# Patient Record
Sex: Male | Born: 1985
Health system: Southern US, Community
[De-identification: ages and names within clinical notes are randomized; demographics above are authoritative.]

## PROBLEM LIST (undated history)

## (undated) DIAGNOSIS — J45909 Unspecified asthma, uncomplicated: Secondary | ICD-10-CM

## (undated) DIAGNOSIS — I10 Essential (primary) hypertension: Secondary | ICD-10-CM

## (undated) DIAGNOSIS — M1990 Unspecified osteoarthritis, unspecified site: Secondary | ICD-10-CM

## (undated) DIAGNOSIS — M109 Gout, unspecified: Secondary | ICD-10-CM

## (undated) HISTORY — PX: KNEE ARTHROSCOPY: SHX127

---
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
## 2003-09-04 ENCOUNTER — Emergency Department (HOSPITAL_COMMUNITY): Admission: EM | Admit: 2003-09-04 | Discharge: 2003-09-04 | Payer: Self-pay | Admitting: Emergency Medicine

## 2003-10-21 ENCOUNTER — Emergency Department (HOSPITAL_COMMUNITY): Admission: EM | Admit: 2003-10-21 | Discharge: 2003-10-22 | Payer: Self-pay | Admitting: Emergency Medicine

## 2003-11-23 ENCOUNTER — Emergency Department (HOSPITAL_COMMUNITY): Admission: EM | Admit: 2003-11-23 | Discharge: 2003-11-23 | Payer: Self-pay | Admitting: Emergency Medicine

## 2010-12-29 ENCOUNTER — Inpatient Hospital Stay (INDEPENDENT_AMBULATORY_CARE_PROVIDER_SITE_OTHER)
Admission: RE | Admit: 2010-12-29 | Discharge: 2010-12-29 | Disposition: A | Discharge status: Home / Self Care | Payer: Self-pay | Source: Ambulatory Visit | Attending: Emergency Medicine | Admitting: Emergency Medicine

## 2010-12-29 DIAGNOSIS — J45909 Unspecified asthma, uncomplicated: Secondary | ICD-10-CM

## 2011-07-06 ENCOUNTER — Emergency Department (HOSPITAL_COMMUNITY): Payer: Self-pay

## 2011-07-06 ENCOUNTER — Emergency Department (HOSPITAL_COMMUNITY)
Admission: EM | Admit: 2011-07-06 | Discharge: 2011-07-06 | Disposition: A | Discharge status: Home / Self Care | Payer: Self-pay | Attending: Emergency Medicine | Admitting: Emergency Medicine

## 2011-07-06 DIAGNOSIS — M79609 Pain in unspecified limb: Secondary | ICD-10-CM | POA: Insufficient documentation

## 2011-07-06 DIAGNOSIS — S61209A Unspecified open wound of unspecified finger without damage to nail, initial encounter: Secondary | ICD-10-CM | POA: Insufficient documentation

## 2011-07-06 DIAGNOSIS — S6990XA Unspecified injury of unspecified wrist, hand and finger(s), initial encounter: Secondary | ICD-10-CM | POA: Insufficient documentation

## 2011-07-06 DIAGNOSIS — S62339A Displaced fracture of neck of unspecified metacarpal bone, initial encounter for closed fracture: Secondary | ICD-10-CM | POA: Insufficient documentation

## 2011-10-08 ENCOUNTER — Emergency Department (HOSPITAL_COMMUNITY)
Admission: EM | Admit: 2011-10-08 | Discharge: 2011-10-08 | Disposition: A | Discharge status: Home / Self Care | Payer: Self-pay | Source: Home / Self Care | Attending: Emergency Medicine | Admitting: Emergency Medicine

## 2011-10-08 ENCOUNTER — Emergency Department (INDEPENDENT_AMBULATORY_CARE_PROVIDER_SITE_OTHER): Payer: Self-pay

## 2011-10-08 ENCOUNTER — Encounter: Payer: Self-pay | Admitting: *Deleted

## 2011-10-08 DIAGNOSIS — R6889 Other general symptoms and signs: Secondary | ICD-10-CM

## 2011-10-08 DIAGNOSIS — J111 Influenza due to unidentified influenza virus with other respiratory manifestations: Secondary | ICD-10-CM

## 2011-10-08 MED ORDER — HYDROCODONE-ACETAMINOPHEN 5-325 MG PO TABS
ORAL_TABLET | ORAL | Status: AC
  Filled 2011-10-08: qty 1

## 2011-10-08 MED ORDER — ALBUTEROL SULFATE HFA 108 (90 BASE) MCG/ACT IN AERS: 1.0000 | INHALATION_SPRAY | Freq: Four times a day (QID) | RESPIRATORY_TRACT | Status: DC | PRN

## 2011-10-08 MED ORDER — TRAMADOL HCL 50 MG PO TABS
100.0000 mg | ORAL_TABLET | Freq: Three times a day (TID) | ORAL | Status: AC | PRN
Start: 2011-10-08 — End: 2011-10-18

## 2011-10-08 MED ORDER — HYDROCODONE-ACETAMINOPHEN 5-325 MG PO TABS
1.0000 | ORAL_TABLET | Freq: Once | ORAL | Status: AC
  Administered 2011-10-08: 1 via ORAL

## 2011-10-08 MED ORDER — BENZONATATE 200 MG PO CAPS: 200.0000 mg | ORAL_CAPSULE | Freq: Three times a day (TID) | ORAL | Status: AC | PRN

## 2011-10-08 MED ORDER — OSELTAMIVIR PHOSPHATE 75 MG PO CAPS: 75.0000 mg | ORAL_CAPSULE | Freq: Two times a day (BID) | ORAL | Status: AC

## 2011-10-08 NOTE — ED Provider Notes (Signed)
History     CSN: [id number] Arrival date & time: 10/08/2011  5:31 PM   First MD Initiated Contact with Patient 10/08/11 1547      Chief Complaint  Patient presents with  . Cough    (Consider location/radiation/quality/duration/timing/severity/associated sxs/prior treatment) HPI Comments: Mr. Karasik has had a one-day history of cough productive of brown sputum, generalized aching, fever up to 101, chills, sweats, fatigue, nasal congestion, sore throat, nausea, and diarrhea. He has not had any known exposures. He has not had a flu shot. He has tried Tylenol flu and cough for the symptoms without relief. He is a cigarette smoker.  Patient is a 25 y.o. male presenting with cough.  Cough Associated symptoms include chills, headaches, rhinorrhea and sore throat. Pertinent negatives include no ear pain, no shortness of breath, no wheezing and no eye redness.    History reviewed. No pertinent past medical history.  Past Surgical History  Procedure Date  . Knee surgery     Family History  Problem Relation Age of Onset  . Hypertension Mother   . Asthma Mother     History  Substance Use Topics  . Smoking status: Current Everyday Smoker  . Smokeless tobacco: Not on file  . Alcohol Use: Yes     occasonal      Review of Systems  Constitutional: Positive for fever, chills, diaphoresis and fatigue.  HENT: Positive for congestion, sore throat and rhinorrhea. Negative for ear pain, sneezing, neck stiffness, voice change and postnasal drip.   Eyes: Negative for pain, discharge and redness.  Respiratory: Positive for cough. Negative for chest tightness, shortness of breath and wheezing.   Gastrointestinal: Positive for nausea and diarrhea. Negative for vomiting and abdominal pain.  Skin: Negative for rash.  Neurological: Positive for headaches.    Allergies  Review of patient's allergies indicates no known allergies.  Home Medications   Current Outpatient Rx  Name Route Sig  Dispense Refill  . PSEUDOEPHEDRINE-ACETAMINOPHEN 30-500 MG PO TABS Oral Take 1 tablet by mouth every 4 (four) hours as needed.      . ALBUTEROL SULFATE HFA 108 (90 BASE) MCG/ACT IN AERS Inhalation Inhale 1-2 puffs into the lungs every 6 (six) hours as needed for wheezing. 1 Inhaler 0  . BENZONATATE 200 MG PO CAPS Oral Take 1 capsule (200 mg total) by mouth 3 (three) times daily as needed for cough. 30 capsule 0  . OSELTAMIVIR PHOSPHATE 75 MG PO CAPS Oral Take 1 capsule (75 mg total) by mouth every 12 (twelve) hours. 10 capsule 0  . TRAMADOL HCL 50 MG PO TABS Oral Take 2 tablets (100 mg total) by mouth every 8 (eight) hours as needed for pain. Maximum dose= 8 tablets per day 30 tablet 0    BP 136/88  Pulse 84  Temp(Src) 100.3 F (37.9 C) (Oral)  Resp 19  SpO2 98%  Physical Exam  Nursing note and vitals reviewed. Constitutional: He appears well-developed and well-nourished. No distress.  HENT:  Head: Normocephalic and atraumatic.  Right Ear: External ear normal.  Left Ear: External ear normal.  Nose: Nose normal.  Mouth/Throat: Oropharynx is clear and moist. No oropharyngeal exudate.  Eyes: Conjunctivae and EOM are normal. Pupils are equal, round, and reactive to light. Right eye exhibits no discharge. Left eye exhibits no discharge.  Neck: Normal range of motion. Neck supple.  Cardiovascular: Normal rate, regular rhythm and normal heart sounds.   Pulmonary/Chest: Effort normal and breath sounds normal. No stridor. No respiratory distress. He has  no wheezes. He has no rales. He exhibits no tenderness.  Lymphadenopathy:    He has no cervical adenopathy.  Skin: Skin is warm and dry. No rash noted. He is not diaphoretic.    ED Course  Procedures (including critical care time)  Labs Reviewed - No data to display Dg Chest 2 View  10/08/2011  *RADIOLOGY REPORT*  Clinical Data: 25 year old male with cough and fever.  CHEST - 2 VIEW  Comparison: None  Findings: Upper limits normal heart  size identified. The lungs are clear. There is no evidence of focal airspace disease, pulmonary edema, pulmonary nodule/mass, pleural effusion, or pneumothorax. No acute bony abnormalities are identified.  IMPRESSION: Upper limits normal heart size without evidence of acute cardiopulmonary disease.  Original Report Authenticated By: Rosendo Gros, M.D.     1. Influenza-like illness       MDM  He appears to have an influenza-like illness. We discussed treatment with Tamiflu, but he doesn't think he can afford the medication. So we'll otherwise treat symptomatically.        Roque Lias, MD 10/08/11 [phone]

## 2011-10-08 NOTE — ED Notes (Signed)
Pt  Reports  Cough  Aching all  Over  Chills  sorethroat         Symptoms  Began this  Am           Speaking in clear  sentances   No  resp  Distress

## 2011-11-06 ENCOUNTER — Emergency Department (HOSPITAL_COMMUNITY)
Admission: EM | Admit: 2011-11-06 | Discharge: 2011-11-06 | Disposition: A | Discharge status: Home / Self Care | Payer: Self-pay | Attending: Emergency Medicine | Admitting: Emergency Medicine

## 2011-11-06 ENCOUNTER — Encounter (HOSPITAL_COMMUNITY): Payer: Self-pay

## 2011-11-06 DIAGNOSIS — R1110 Vomiting, unspecified: Secondary | ICD-10-CM | POA: Insufficient documentation

## 2011-11-06 DIAGNOSIS — R1013 Epigastric pain: Secondary | ICD-10-CM | POA: Insufficient documentation

## 2011-11-06 DIAGNOSIS — K2970 Gastritis, unspecified, without bleeding: Secondary | ICD-10-CM | POA: Insufficient documentation

## 2011-11-06 DIAGNOSIS — R10816 Epigastric abdominal tenderness: Secondary | ICD-10-CM | POA: Insufficient documentation

## 2011-11-06 DIAGNOSIS — R197 Diarrhea, unspecified: Secondary | ICD-10-CM | POA: Insufficient documentation

## 2011-11-06 DIAGNOSIS — F17200 Nicotine dependence, unspecified, uncomplicated: Secondary | ICD-10-CM | POA: Insufficient documentation

## 2011-11-06 LAB — COMPREHENSIVE METABOLIC PANEL
ALT: 39 U/L (ref 0–53)
AST: 26 U/L (ref 0–37)
Calcium: 9.8 mg/dL (ref 8.4–10.5)
Creatinine, Ser: 0.97 mg/dL (ref 0.50–1.35)
GFR calc Af Amer: >90 mL/min (ref >90)
Sodium: 136 mEq/L (ref 135–145)
Total Protein: 7.8 g/dL (ref 6.0–8.3)

## 2011-11-06 LAB — CBC
MCH: 31 pg (ref 26.0–34.0)
MCHC: 35.9 g/dL (ref 30.0–36.0)
MCV: 86.2 fL (ref 78.0–100.0)
Platelets: 172 10*3/uL (ref 150–400)
RDW: 12.4 % (ref 11.5–15.5)

## 2011-11-06 LAB — DIFFERENTIAL
Basophils Absolute: 0 10*3/uL (ref 0.0–0.1)
Eosinophils Absolute: 0 10*3/uL (ref 0.0–0.7)
Eosinophils Relative: 0 % (ref 0–5)

## 2011-11-06 LAB — LIPASE, BLOOD: Lipase: 19 U/L (ref 11–59)

## 2011-11-06 MED ORDER — PANTOPRAZOLE SODIUM 40 MG IV SOLR
40.0000 mg | Freq: Once | INTRAVENOUS | Status: AC
  Administered 2011-11-06: 40 mg via INTRAVENOUS
  Filled 2011-11-06: qty 40

## 2011-11-06 MED ORDER — PROMETHAZINE HCL 25 MG PO TABS: 25.0000 mg | ORAL_TABLET | Freq: Four times a day (QID) | ORAL | Status: AC | PRN

## 2011-11-06 MED ORDER — RANITIDINE HCL 150 MG PO CAPS: 150.0000 mg | ORAL_CAPSULE | Freq: Two times a day (BID) | ORAL | Status: DC

## 2011-11-06 MED ORDER — SODIUM CHLORIDE 0.9 % IV BOLUS (SEPSIS)
1000.0000 mL | Freq: Once | INTRAVENOUS | Status: AC
  Administered 2011-11-06: 1000 mL via INTRAVENOUS

## 2011-11-06 MED ORDER — ONDANSETRON HCL 4 MG/2ML IJ SOLN
4.0000 mg | Freq: Once | INTRAMUSCULAR | Status: AC
  Administered 2011-11-06: 4 mg via INTRAVENOUS
  Filled 2011-11-06: qty 2

## 2011-11-06 NOTE — ED Provider Notes (Cosign Needed)
History     CSN: [id number]  Arrival date & time 11/06/11  1438   First MD Initiated Contact with Patient 11/06/11 1828      Chief Complaint  Patient presents with  . Abdominal Pain    (Consider location/radiation/quality/duration/timing/severity/associated sxs/prior treatment) Patient is a 25 y.o. male presenting with abdominal pain. The history is provided by the patient (Patient states that he has been having vomiting and some diarrhea with mild epigastric discomfort since he drank a lot of alcohol last night.).  Abdominal Pain The primary symptoms of the illness include abdominal pain, vomiting and diarrhea. The primary symptoms of the illness do not include fever, fatigue or shortness of breath. The current episode started 13 to 24 hours ago. The onset of the illness was sudden.  The abdominal pain began 3 to 5 hours ago. The pain came on suddenly. The abdominal pain does not radiate. The severity of the abdominal pain is 2/10. The abdominal pain is relieved by nothing. The abdominal pain is exacerbated by movement.  The illness is associated with alcohol use. The patient states that she believes she is currently not pregnant. The patient has not had a change in bowel habit. Symptoms associated with the illness do not include heartburn, hematuria, frequency or back pain.    History reviewed. No pertinent past medical history.  Past Surgical History  Procedure Date  . Knee surgery     Family History  Problem Relation Age of Onset  . Hypertension Mother   . Asthma Mother     History  Substance Use Topics  . Smoking status: Current Everyday Smoker  . Smokeless tobacco: Not on file  . Alcohol Use: Yes     occasonal      Review of Systems  Constitutional: Negative for fever and fatigue.  HENT: Negative for congestion, sinus pressure and ear discharge.   Eyes: Negative for discharge.  Respiratory: Negative for cough and shortness of breath.   Cardiovascular: Negative  for chest pain.  Gastrointestinal: Positive for vomiting, abdominal pain and diarrhea. Negative for heartburn.  Genitourinary: Negative for frequency and hematuria.  Musculoskeletal: Negative for back pain.  Skin: Negative for rash.  Neurological: Negative for seizures and headaches.  Hematological: Negative.   Psychiatric/Behavioral: Negative for hallucinations.    Allergies  Fish allergy and Other  Home Medications   Current Outpatient Rx  Name Route Sig Dispense Refill  . ACETAMINOPHEN 500 MG PO TABS Oral Take 1,500 mg by mouth every 6 (six) hours as needed. For pain    . PROMETHAZINE HCL 25 MG PO TABS Oral Take 1 tablet (25 mg total) by mouth every 6 (six) hours as needed for nausea. 15 tablet 0  . RANITIDINE HCL 150 MG PO CAPS Oral Take 1 capsule (150 mg total) by mouth 2 (two) times daily. 60 capsule 0    BP 139/98  Pulse 62  Temp(Src) 97.9 F (36.6 C) (Oral)  Resp 16  Ht 6' (1.829 m)  Wt 200 lb (90.719 kg)  BMI 27.12 kg/m2  SpO2 99%  Physical Exam  Constitutional: He is oriented to person, place, and time. He appears well-developed.  HENT:  Head: Normocephalic and atraumatic.  Eyes: Conjunctivae and EOM are normal. No scleral icterus.  Neck: Neck supple. No thyromegaly present.  Cardiovascular: Normal rate and regular rhythm.  Exam reveals no gallop and no friction rub.   No murmur heard. Pulmonary/Chest: No stridor. He has no wheezes. He has no rales. He exhibits no tenderness.  Abdominal: He exhibits no distension. There is tenderness. There is no rebound.       Mild epigastric tendernous  Musculoskeletal: Normal range of motion. He exhibits no edema.  Lymphadenopathy:    He has no cervical adenopathy.  Neurological: He is oriented to person, place, and time. Coordination normal.  Skin: No rash noted. No erythema.  Psychiatric: He has a normal mood and affect. His behavior is normal.    ED Course  Procedures (including critical care time)  Labs Reviewed    CBC - Abnormal; Notable for the following:    WBC 10.8 (*)    All other components within normal limits  DIFFERENTIAL - Abnormal; Notable for the following:    Neutrophils Relative 86 (*)    Neutro Abs 9.3 (*)    Monocytes Relative 2 (*)    All other components within normal limits  COMPREHENSIVE METABOLIC PANEL  ETHANOL  LIPASE, BLOOD   No results found.   1. Gastritis       MDM  Pt improved with fluids and protonix,  gastritis        Benny Lennert, MD 11/06/11 [phone]

## 2011-11-06 NOTE — ED Notes (Signed)
Pt. Was drinking all night and woke up with severe abdominal pain n/v

## 2012-04-17 ENCOUNTER — Emergency Department (HOSPITAL_COMMUNITY)
Admission: EM | Admit: 2012-04-17 | Discharge: 2012-04-17 | Disposition: A | Discharge status: Home / Self Care | Payer: Self-pay | Attending: Emergency Medicine | Admitting: Emergency Medicine

## 2012-04-17 ENCOUNTER — Encounter (HOSPITAL_COMMUNITY): Payer: Self-pay | Admitting: *Deleted

## 2012-04-17 ENCOUNTER — Emergency Department (HOSPITAL_COMMUNITY): Payer: Self-pay

## 2012-04-17 DIAGNOSIS — IMO0001 Reserved for inherently not codable concepts without codable children: Secondary | ICD-10-CM | POA: Insufficient documentation

## 2012-04-17 DIAGNOSIS — J45909 Unspecified asthma, uncomplicated: Secondary | ICD-10-CM | POA: Insufficient documentation

## 2012-04-17 DIAGNOSIS — R509 Fever, unspecified: Secondary | ICD-10-CM | POA: Insufficient documentation

## 2012-04-17 HISTORY — DX: Unspecified asthma, uncomplicated: J45.909

## 2012-04-17 LAB — COMPREHENSIVE METABOLIC PANEL
ALT: 32 U/L (ref 0–53)
AST: 22 U/L (ref 0–37)
Albumin: 4.1 g/dL (ref 3.5–5.2)
Alkaline Phosphatase: 98 U/L (ref 39–117)
BUN: 12 mg/dL (ref 6–23)
Chloride: 102 mEq/L (ref 96–112)
Potassium: 3.6 mEq/L (ref 3.5–5.1)
Sodium: 136 mEq/L (ref 135–145)
Total Bilirubin: 0.7 mg/dL (ref 0.3–1.2)
Total Protein: 7.2 g/dL (ref 6.0–8.3)

## 2012-04-17 LAB — CBC
HCT: 37.5 % — ABNORMAL LOW (ref 39.0–52.0)
RDW: 12.1 % (ref 11.5–15.5)
WBC: 14.5 10*3/uL — ABNORMAL HIGH (ref 4.0–10.5)

## 2012-04-17 LAB — LACTIC ACID, PLASMA: Lactic Acid, Venous: 1.3 mmol/L (ref 0.5–2.2)

## 2012-04-17 MED ORDER — EPINEPHRINE 0.3 MG/0.3ML IJ DEVI: 0.3000 mg | Freq: Once | INTRAMUSCULAR | Status: DC

## 2012-04-17 MED ORDER — ACETAMINOPHEN 500 MG PO TABS
1000.0000 mg | ORAL_TABLET | Freq: Once | ORAL | Status: AC
  Administered 2012-04-17: 1000 mg via ORAL
  Filled 2012-04-17: qty 2

## 2012-04-17 MED ORDER — SODIUM CHLORIDE 0.9 % IV BOLUS (SEPSIS)
2000.0000 mL | Freq: Once | INTRAVENOUS | Status: AC
  Administered 2012-04-17: 2000 mL via INTRAVENOUS

## 2012-04-17 MED ORDER — IBUPROFEN 800 MG PO TABS
800.0000 mg | ORAL_TABLET | Freq: Once | ORAL | Status: AC
  Administered 2012-04-17: 800 mg via ORAL
  Filled 2012-04-17: qty 1

## 2012-04-17 MED ORDER — ONDANSETRON HCL 4 MG/2ML IJ SOLN
INTRAMUSCULAR | Status: AC
  Administered 2012-04-17: 17:00:00
  Filled 2012-04-17: qty 2

## 2012-04-17 NOTE — ED Notes (Signed)
Per EMS report, pt from home: sx started this am when he got home from work, got progressively worse, endorses N/V, Weak but able to walk down steps, 18 left AC, 4mg  zofran, fluids but TKO, hot to touch but pt says he's freezing.  VSS. NSR enroute, not vomiting witness.

## 2012-04-17 NOTE — Discharge Instructions (Signed)
Fever, Adult Take Tylenol as directed every 4 hours while awake for temperature higher than 100.4. Go to an urgent care Center or return if you feel worse tomorrow or sooner as needed. Make sure to  drink lots of fluids A fever is a temperature of 100.4 F (38 C) or above.  HOME CARE  Take fever medicine as told by your doctor. Do not  take aspirin for fever if you are younger than 28 years of age.   If you are given antibiotic medicine, take it as told. Finish the medicine even if you start to feel better.   Rest.   Drink enough fluids to keep your pee (urine) clear or pale yellow. Do not drink alcohol.   Take a bath or shower with room temperature water. Do not use ice water or alcohol sponge baths.   Wear lightweight, loose clothes.  GET HELP RIGHT AWAY IF:   You are short of breath or have trouble breathing.   You are very weak.   You are dizzy or you pass out (faint).   You are very thirsty or are making little or no urine.   You have new pain.   You throw up (vomit) or have watery poop (diarrhea).   You keep throwing up or having watery poop for more than 1 to 2 days.   You have a stiff neck or light bothers your eyes.   You have a skin rash.   You have a fever or problems (symptoms) that last for more than 2 to 3 days.   You have a fever and your problems quickly get worse.   You keep throwing up the fluids you drink.   You do not feel better after 3 days.   You have new problems.  MAKE SURE YOU:   Understand these instructions.   Will watch your condition.   Will get help right away if you are not doing well or get worse.  Document Released: 08/04/2008 Document Revised: 10/15/2011 Document Reviewed: 08/27/2011 [facility] Patient Information 2012 [place], [place].

## 2012-04-17 NOTE — ED Notes (Signed)
Pt verbalizes understanding 

## 2012-04-17 NOTE — ED Provider Notes (Addendum)
History     CSN: [id number]  Arrival date & time 04/17/12  1616   First MD Initiated Contact with Patient 04/17/12 1657      Chief Complaint  Patient presents with  . Generalized Body Aches    (Consider location/radiation/quality/duration/timing/severity/associated sxs/prior treatment) HPI Presents with diffuse body aches and myalgias chills leg abdominal discomfort onset 5 AM today denies headache denies chest pain admits to slight cough. Vomited one time since onset of illness treated by EMS with Zofran. Present he denies nausea. No other associated symptoms. Other associated symptoms include a rash on patient's back for 2 days noted by his girlfriend Past Medical History  Diagnosis Date  . Asthma     Past Surgical History  Procedure Date  . Knee surgery   . Knee surgery     Family History  Problem Relation Age of Onset  . Hypertension Mother   . Asthma Mother     History  Substance Use Topics  . Smoking status: Current Everyday Smoker  . Smokeless tobacco: Not on file  . Alcohol Use: Yes     occasonal      Review of Systems  Constitutional: Negative.   HENT: Negative.   Respiratory: Positive for cough.   Cardiovascular: Negative.   Gastrointestinal: Positive for vomiting.  Musculoskeletal: Positive for myalgias.  Skin: Positive for rash.  Neurological: Negative.   Hematological: Negative.   Psychiatric/Behavioral: Negative.   All other systems reviewed and are negative.    Allergies  Fish allergy and Other  Home Medications   Current Outpatient Rx  Name Route Sig Dispense Refill  . ALBUTEROL SULFATE HFA 108 (90 BASE) MCG/ACT IN AERS Inhalation Inhale 2 puffs into the lungs every 6 (six) hours as needed. Wheezing or shortness of breath.      BP 111/63  Pulse 98  Temp(Src) 102.4 F (39.1 C) (Oral)  Resp 18  SpO2 98%  Physical Exam  Nursing note and vitals reviewed. Constitutional: He appears well-developed and well-nourished.       Sleepy  arousable easily  HENT:  Head: Normocephalic and atraumatic.  Eyes: Conjunctivae are normal. Pupils are equal, round, and reactive to light.  Neck: Neck supple. No tracheal deviation present. No thyromegaly present.  Cardiovascular: Normal rate and regular rhythm.   No murmur heard. Pulmonary/Chest: Effort normal and breath sounds normal.  Abdominal: Soft. Bowel sounds are normal. He exhibits no distension. There is no tenderness.       OBese  Genitourinary: Penis normal.  Musculoskeletal: Normal range of motion. He exhibits no edema and no tenderness.  Neurological: He is alert. Coordination normal.  Skin: Skin is warm and dry. No rash noted.       There is an baseball sized psoriatic appearing patch of skin at right scapular area  Psychiatric: He has a normal mood and affect.    ED Course  Procedures (including critical care time)  Labs Reviewed - No data to display No results found. 9:45 PM patient is alert awake feels much improved after treatment with antipyretics and intravenous fluids. The patient now asymptomatic alert and talkative. Rash is unchanged No diagnosis found.  Results for orders placed during the hospital encounter of 04/17/12  LACTIC ACID, PLASMA      Component Value Range   Lactic Acid, Venous 1.3  0.5 - 2.2 (mmol/L)  CBC      Component Value Range   WBC 14.5 (*) 4.0 - 10.5 (K/uL)   RBC 4.41  4.22 - 5.81 (MIL/uL)  Hemoglobin 13.4  13.0 - 17.0 (g/dL)   HCT 19.1 (*) 47.8 - 52.0 (%)   MCV 85.0  78.0 - 100.0 (fL)   MCH 30.4  26.0 - 34.0 (pg)   MCHC 35.7  30.0 - 36.0 (g/dL)   RDW 29.5  62.1 - 30.8 (%)   Platelets 166  150 - 400 (K/uL)  COMPREHENSIVE METABOLIC PANEL      Component Value Range   Sodium 136  135 - 145 (mEq/L)   Potassium 3.6  3.5 - 5.1 (mEq/L)   Chloride 102  96 - 112 (mEq/L)   CO2 24  19 - 32 (mEq/L)   Glucose, Bld 98  70 - 99 (mg/dL)   BUN 12  6 - 23 (mg/dL)   Creatinine, Ser 6.57  0.50 - 1.35 (mg/dL)   Calcium 9.1  8.4 - 84.6  (mg/dL)   Total Protein 7.2  6.0 - 8.3 (g/dL)   Albumin 4.1  3.5 - 5.2 (g/dL)   AST 22  0 - 37 (U/L)   ALT 32  0 - 53 (U/L)   Alkaline Phosphatase 98  39 - 117 (U/L)   Total Bilirubin 0.7  0.3 - 1.2 (mg/dL)   GFR calc non Af Amer >90  >90 (mL/min)   GFR calc Af Amer >90  >90 (mL/min)   Dg Chest 2 View  04/17/2012  *RADIOLOGY REPORT*  Clinical Data: Generalized body aches.  Cough and fever.  CHEST - 2 VIEW  Comparison: 10/08/2011  Findings: Lateral view degraded by patient arm position, minimal motion. Midline trachea.  Mild cardiomegaly. Mediastinal contours otherwise within normal limits.  No pleural effusion or pneumothorax.  No congestive failure.  Low lung volumes with resultant pulmonary interstitial prominence.  IMPRESSION: Cardiomegaly and low lung volumes. No acute findings.  Degraded lateral view.  Original Report Authenticated By: Consuello Bossier, M.D.   Xray reviewed by me  MDM  Patient likely with psoriasis as cause of rash. His girlfriend states that he had similar patches in the past which have healed spontaneously. Rash is not indicative ofECM Assessment suspect viral illness Plan Tylenol encourage fluids. Prescription for EpiPen at patient's request as he has no primary care physician and is allergic bees Recheck tomorrow if not feeling improved Diagnosis febrile illness       Doug Sou, MD 04/17/12 2158  Doug Sou, MD 04/17/12 2159

## 2012-04-17 NOTE — ED Notes (Signed)
1st set of blood cultures sent to lab.  

## 2012-04-17 NOTE — ED Notes (Signed)
VHQ:IO96<EX> Expected date:04/17/12<BR> Expected time: 3:57 PM<BR> Means of arrival:Ambulance<BR> Comments:<BR> M11. 25 m. FLU LIKE SYMPTOMS. Started this morning, sudden onset, achy all over, hot to touch, vomiting. 4mg  zofran. 10 mins

## 2012-09-15 ENCOUNTER — Emergency Department (INDEPENDENT_AMBULATORY_CARE_PROVIDER_SITE_OTHER)
Admission: EM | Admit: 2012-09-15 | Discharge: 2012-09-15 | Disposition: A | Discharge status: Home / Self Care | Payer: Self-pay | Source: Home / Self Care | Attending: Emergency Medicine | Admitting: Emergency Medicine

## 2012-09-15 ENCOUNTER — Encounter (HOSPITAL_COMMUNITY): Payer: Self-pay | Admitting: Emergency Medicine

## 2012-09-15 DIAGNOSIS — I891 Lymphangitis: Secondary | ICD-10-CM

## 2012-09-15 DIAGNOSIS — T63481A Toxic effect of venom of other arthropod, accidental (unintentional), initial encounter: Secondary | ICD-10-CM

## 2012-09-15 DIAGNOSIS — T6391XA Toxic effect of contact with unspecified venomous animal, accidental (unintentional), initial encounter: Secondary | ICD-10-CM

## 2012-09-15 MED ORDER — DOXYCYCLINE HYCLATE 100 MG PO CAPS: 100.0000 mg | ORAL_CAPSULE | Freq: Two times a day (BID) | ORAL | Status: DC

## 2012-09-15 MED ORDER — PREDNISONE 20 MG PO TABS: 40.0000 mg | ORAL_TABLET | Freq: Every day | ORAL | Status: AC

## 2012-09-15 MED ORDER — EPINEPHRINE 0.3 MG/0.3ML IJ DEVI: 0.3000 mg | Freq: Once | INTRAMUSCULAR | Status: DC

## 2012-09-15 NOTE — ED Provider Notes (Signed)
History     CSN: [id number]  Arrival date & time 09/15/12  1129   First MD Initiated Contact with Patient 09/15/12 1320      Chief Complaint  Patient presents with  . Insect Bite    pt states swelling of left arm and hand  pt ? bee sting, allergic to bees was swatting at a bee yesterday.    (Consider location/radiation/quality/duration/timing/severity/associated sxs/prior treatment) HPI Comments: Patient presents urgent care describing that yesterday he was swatting atopy any suspect that maybe it partially stung him somewhere between his fingers on his left hand as it became red and swollen. 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 (Patient denies using an EpiPen and denies having experienced any swelling of his mouth, throat, or having difficulty breathing).  Patient describes that the redness has slowly ascended to his finger to the dorsal aspect of his hand and now is only up to the mid region of his left forearm. It is tender at touch swollen and there is a streak of redness. Patient denies any fevers chills or changes in appetite.  The history is provided by the patient.    Past Medical History  Diagnosis Date  . Asthma     Past Surgical History  Procedure Date  . Knee surgery   . Knee surgery     Family History  Problem Relation Age of Onset  . Hypertension Mother   . Asthma Mother     History  Substance Use Topics  . Smoking status: Current Every Day Smoker  . Smokeless tobacco: Not on file  . Alcohol Use: Yes     Comment: occasonal      Review of Systems  Constitutional: Negative for fever, chills, activity change and appetite change.  Skin: Positive for color change and rash.    Allergies  Bee venom; Fish allergy; and Other  Home Medications   Current Outpatient Rx  Name  Route  Sig  Dispense  Refill  . ALBUTEROL SULFATE HFA 108 (90 BASE) MCG/ACT IN AERS   Inhalation  Inhale 2 puffs into the lungs every 6 (six) hours as needed. Wheezing or shortness of breath.         . DOXYCYCLINE HYCLATE 100 MG PO CAPS   Oral   Take 1 capsule (100 mg total) by mouth 2 (two) times daily.   20 capsule   0   . EPINEPHRINE 0.3 MG/0.3ML IJ DEVI   Intramuscular   Inject 0.3 mLs (0.3 mg total) into the muscle once.   1 Device   0   . PREDNISONE 20 MG PO TABS   Oral   Take 2 tablets (40 mg total) by mouth daily. 2 tablets daily for 5 days   10 tablet   0     BP 156/84  Pulse 62  Temp 98.2 F (36.8 C) (Oral)  Resp 16  SpO2 100%  Physical Exam  Nursing note and vitals reviewed. Constitutional: Vital signs are normal. He appears well-developed and well-nourished.  Non-toxic appearance. He does not have a sickly appearance. He does not appear ill. No distress.  Musculoskeletal: He exhibits tenderness.       Arms: Neurological: He is alert.  Skin: There is erythema.    ED Course  Procedures (including critical care time)  Labs Reviewed - No data to display No results found.   1. Insect sting   2. Lymphangitis  MDM  Allergenic reaction versus ascending lymphangitis. Although etiology could very well be a allergenic physiology will start patient on antibiotics as area feels tender warm, consider a secondary superimposed bacterial infection on this insect sting. Patient is otherwise comfortable in no distress and with no mucosa or respiratory distress. Have also encouraged patient to use ice packs. Provided him with a prescription refill of his EpiPen.       Jimmie Molly, MD 09/15/12 1535

## 2012-09-15 NOTE — ED Notes (Signed)
Pt states that he was swatting at a bee yesterday shortly after notice arm red and swollen and having pain.  Red streaking down left arm. Several bites around wrist area and on middle finger. Pt also ?'s some kind of bug bite.  Pt states that the pain is a constant throbbing feeling.

## 2012-09-23 ENCOUNTER — Emergency Department (HOSPITAL_COMMUNITY)
Admission: EM | Admit: 2012-09-23 | Discharge: 2012-09-23 | Disposition: A | Discharge status: Home / Self Care | Payer: Self-pay | Attending: Emergency Medicine | Admitting: Emergency Medicine

## 2012-09-23 ENCOUNTER — Encounter (HOSPITAL_COMMUNITY): Payer: Self-pay | Admitting: *Deleted

## 2012-09-23 DIAGNOSIS — F17200 Nicotine dependence, unspecified, uncomplicated: Secondary | ICD-10-CM | POA: Insufficient documentation

## 2012-09-23 DIAGNOSIS — Z202 Contact with and (suspected) exposure to infections with a predominantly sexual mode of transmission: Secondary | ICD-10-CM | POA: Insufficient documentation

## 2012-09-23 DIAGNOSIS — J45909 Unspecified asthma, uncomplicated: Secondary | ICD-10-CM | POA: Insufficient documentation

## 2012-09-23 MED ORDER — CEFTRIAXONE SODIUM 250 MG IJ SOLR
250.0000 mg | Freq: Once | INTRAMUSCULAR | Status: AC
  Administered 2012-09-23: 250 mg via INTRAMUSCULAR
  Filled 2012-09-23: qty 250

## 2012-09-23 MED ORDER — LIDOCAINE HCL (PF) 1 % IJ SOLN
INTRAMUSCULAR | Status: AC
  Administered 2012-09-23: 0.9 mL
  Filled 2012-09-23: qty 5

## 2012-09-23 MED ORDER — METRONIDAZOLE 500 MG PO TABS: 500.0000 mg | ORAL_TABLET | Freq: Two times a day (BID) | ORAL | Status: DC

## 2012-09-23 MED ORDER — AZITHROMYCIN 250 MG PO TABS
1000.0000 mg | ORAL_TABLET | Freq: Once | ORAL | Status: AC
  Administered 2012-09-23: 1000 mg via ORAL
  Filled 2012-09-23: qty 4

## 2012-09-23 NOTE — ED Notes (Signed)
?  STD - trich. No sx. Gf got std from him. ~ 3 mos.

## 2012-09-23 NOTE — ED Provider Notes (Signed)
History   This chart was scribed for [employer]. Rubin Payor, MD, by Marcina Millard scribe. The patient was seen in room TR06C/TR06C and the patient's care was started at 1846.    CSN: [id number]  Arrival date & time 09/23/12  4098   First MD Initiated Contact with Patient 09/23/12 1846      No chief complaint on file.   (Consider location/radiation/quality/duration/timing/severity/associated sxs/prior treatment) HPI Comments: Cameron Greene is a 26 y.o. male who presents to the Emergency Department complaining of recent exposure to an STD. He states this his girlfriend was just diagnosed with trichomoniasis from an encounter with another person 3 months ago. He denies any current symptoms. He has no known allergies to medication.    Past Medical History  Diagnosis Date  . Asthma     Past Surgical History  Procedure Date  . Knee surgery   . Knee surgery     Family History  Problem Relation Age of Onset  . Hypertension Mother   . Asthma Mother     History  Substance Use Topics  . Smoking status: Current Every Day Smoker  . Smokeless tobacco: Not on file  . Alcohol Use: Yes     Comment: occasonal      Review of Systems  All other systems reviewed and are negative.    Allergies  Bee venom; Fish allergy; and Other  Home Medications   Current Outpatient Rx  Name  Route  Sig  Dispense  Refill  . METRONIDAZOLE 500 MG PO TABS   Oral   Take 1 tablet (500 mg total) by mouth 2 (two) times daily.   14 tablet   0     BP 155/85  Temp 98.2 F (36.8 C) (Oral)  Resp 14  SpO2 98%  Physical Exam  Nursing note and vitals reviewed. Constitutional: He is oriented to person, place, and time. He appears well-developed and well-nourished. No distress.  HENT:  Head: Normocephalic.  Neck: No tracheal deviation present.  Pulmonary/Chest: No respiratory distress.  Abdominal: Soft. He exhibits no distension.  Genitourinary: Penis normal. No penile tenderness.   No skin lesions. Minimal discharge on swab.  Musculoskeletal: Normal range of motion. He exhibits no edema.  Neurological: He is alert and oriented to person, place, and time.  Skin: Skin is warm and dry.  Psychiatric: He has a normal mood and affect. His behavior is normal.    ED Course  Procedures (including critical care time)   COORDINATION OF CARE:  18:55- Discussed planned course of treatment with the patient, including STD testing, who is agreeable at this time.  19:13- Medication Orders- lidocaine (XYLOCAINE) 1% injection  19:15- Medication Orders- azithromycin (ZITHROMAX) tablet 1,000 mg- Once, ceftriaxone (ROCEPHIN) injection 250 mg- Once.   Labs Reviewed  GC/CHLAMYDIA PROBE AMP   No results found.   1. Exposure to STD       MDM  Patient was told by sexual partner that she has trichomoniasis. Patient is without symptoms. Swab was done and patient was treated for gonorrhea and Chlamydia. Patient did not want HIV testing. He'll be treated with Flagyl  I personally performed the services described in this documentation, which was scribed in my presence. The recorded information has been reviewed and is accurate.         Juliet Rude. Rubin Payor, MD 09/23/12 1932

## 2012-09-23 NOTE — ED Notes (Signed)
Patient is alert and orientedx4.  Patient was explained discharge instructions and he does not have any questions.

## 2012-09-24 LAB — GC/CHLAMYDIA PROBE AMP: CT Probe RNA: NEGATIVE

## 2013-07-01 ENCOUNTER — Emergency Department (HOSPITAL_COMMUNITY): Payer: Self-pay

## 2013-07-01 ENCOUNTER — Emergency Department (HOSPITAL_COMMUNITY)
Admission: EM | Admit: 2013-07-01 | Discharge: 2013-07-01 | Disposition: A | Discharge status: Home / Self Care | Payer: Self-pay | Attending: Emergency Medicine | Admitting: Emergency Medicine

## 2013-07-01 ENCOUNTER — Encounter (HOSPITAL_COMMUNITY): Payer: Self-pay | Admitting: *Deleted

## 2013-07-01 DIAGNOSIS — S62309A Unspecified fracture of unspecified metacarpal bone, initial encounter for closed fracture: Secondary | ICD-10-CM

## 2013-07-01 DIAGNOSIS — S62329A Displaced fracture of shaft of unspecified metacarpal bone, initial encounter for closed fracture: Secondary | ICD-10-CM | POA: Insufficient documentation

## 2013-07-01 DIAGNOSIS — Y929 Unspecified place or not applicable: Secondary | ICD-10-CM | POA: Insufficient documentation

## 2013-07-01 DIAGNOSIS — M25441 Effusion, right hand: Secondary | ICD-10-CM

## 2013-07-01 DIAGNOSIS — J45909 Unspecified asthma, uncomplicated: Secondary | ICD-10-CM | POA: Insufficient documentation

## 2013-07-01 DIAGNOSIS — F17200 Nicotine dependence, unspecified, uncomplicated: Secondary | ICD-10-CM | POA: Insufficient documentation

## 2013-07-01 DIAGNOSIS — W2209XA Striking against other stationary object, initial encounter: Secondary | ICD-10-CM | POA: Insufficient documentation

## 2013-07-01 DIAGNOSIS — Y9389 Activity, other specified: Secondary | ICD-10-CM | POA: Insufficient documentation

## 2013-07-01 MED ORDER — HYDROCODONE-ACETAMINOPHEN 5-325 MG PO TABS: 1.0000 | ORAL_TABLET | ORAL | Status: DC | PRN

## 2013-07-01 MED ORDER — HYDROCODONE-ACETAMINOPHEN 5-325 MG PO TABS
2.0000 | ORAL_TABLET | Freq: Once | ORAL | Status: AC
  Administered 2013-07-01: 2 via ORAL
  Filled 2013-07-01: qty 2

## 2013-07-01 MED ORDER — IBUPROFEN 800 MG PO TABS
800.0000 mg | ORAL_TABLET | Freq: Once | ORAL | Status: AC
  Administered 2013-07-01: 800 mg via ORAL
  Filled 2013-07-01: qty 1

## 2013-07-01 NOTE — ED Notes (Signed)
Pt states his right hand got slammed in door,  He has significant swelling and pain

## 2013-07-01 NOTE — Progress Notes (Signed)
Orthopedic Tech Progress Note Patient Details:  Cameron Greene 1986/05/03 [id number]  Ortho Devices Type of Ortho Device: Volar splint   Haskell Flirt 07/01/2013, 6:35 AM

## 2013-07-01 NOTE — ED Provider Notes (Signed)
  CSN: [id number]     Arrival date & time 07/01/13  0419 History     First MD Initiated Contact with Patient 07/01/13 0447     Chief Complaint  Patient presents with  . Hand Injury   (Consider location/radiation/quality/duration/timing/severity/associated sxs/prior Treatment) HPI Comments: 27 yo male with right hand pain and swelling since slamming it into a door yesterday evening.  Pain with movement. Pt was not in a fight.  No other injuries. Initially tried OTC and ice but worsened. No open wounds.    Patient is a 27 y.o. male presenting with hand injury. The history is provided by the patient.  Hand Injury Associated symptoms: no fever     Past Medical History  Diagnosis Date  . Asthma    Past Surgical History  Procedure Laterality Date  . Knee surgery    . Knee surgery     Family History  Problem Relation Age of Onset  . Hypertension Mother   . Asthma Mother    History  Substance Use Topics  . Smoking status: Current Every Day Smoker  . Smokeless tobacco: Not on file  . Alcohol Use: Yes     Comment: occasonal    Review of Systems  Constitutional: Negative for fever.  Musculoskeletal: Positive for joint swelling.  Skin: Positive for wound.  Neurological: Negative for weakness and numbness.    Allergies  Bee venom; Fish allergy; Apple; and Banana  Home Medications  No current outpatient prescriptions on file. BP 140/66  Pulse 84  Resp 18  SpO2 96% Physical Exam  Nursing note and vitals reviewed. Constitutional: He appears well-developed and well-nourished. No distress.  HENT:  Head: Normocephalic and atraumatic.  Cardiovascular: Normal rate.   Pulmonary/Chest: Effort normal.  Musculoskeletal: He exhibits edema and tenderness.  Tender and moderate swelling to all metacarpals dorsally, nv intact, mild decr flexion of all fingers on right hand secondary to swelling and pain, nv intact  Neurological: He is alert.  Skin: Skin is warm.    ED Course    Procedures (including critical care time)  Labs Reviewed - No data to display No results found. No diagnosis found.  MDM  4th metacarpal fx. Pain meds in ED. Xray reviewed, metacarpal fx 4th, right hand.  Splint placed by ortho tech. Fup with ortho discussed.  Dg Hand Complete Right  07/01/2013   *RADIOLOGY REPORT*  Clinical Data: Swelling across the metacarpals after injury yesterday.  RIGHT HAND - COMPLETE 3+ VIEW  Comparison: 07/06/2011  Findings: Transverse fracture of the mid shaft right fourth metacarpal bone with dorsal displacement and volar angulation of the distal fracture fragments.  Dorsal soft tissue swelling.  Old fracture deformity of the fifth metacarpal bone.  No focal bone lesion or bone destruction.  IMPRESSION: Transverse fracture of the mid shaft right fourth metacarpal bone.   Original Report Authenticated By: Burman Nieves, M.D.    DC  Enid Skeens, MD 07/01/13 [phone]

## 2016-12-08 ENCOUNTER — Encounter (HOSPITAL_COMMUNITY): Payer: Self-pay | Admitting: Emergency Medicine

## 2016-12-08 ENCOUNTER — Emergency Department (HOSPITAL_COMMUNITY)
Admission: EM | Admit: 2016-12-08 | Discharge: 2016-12-08 | Disposition: A | Discharge status: Home / Self Care | Payer: Self-pay | Attending: Emergency Medicine | Admitting: Emergency Medicine

## 2016-12-08 ENCOUNTER — Emergency Department (HOSPITAL_COMMUNITY): Payer: Self-pay

## 2016-12-08 DIAGNOSIS — J45901 Unspecified asthma with (acute) exacerbation: Secondary | ICD-10-CM | POA: Insufficient documentation

## 2016-12-08 DIAGNOSIS — Z79899 Other long term (current) drug therapy: Secondary | ICD-10-CM | POA: Insufficient documentation

## 2016-12-08 MED ORDER — IPRATROPIUM BROMIDE 0.02 % IN SOLN
0.5000 mg | Freq: Once | RESPIRATORY_TRACT | Status: AC
  Administered 2016-12-08: 0.5 mg via RESPIRATORY_TRACT
  Filled 2016-12-08: qty 2.5

## 2016-12-08 MED ORDER — ALBUTEROL SULFATE (2.5 MG/3ML) 0.083% IN NEBU
5.0000 mg | INHALATION_SOLUTION | Freq: Once | RESPIRATORY_TRACT | Status: AC
  Administered 2016-12-08: 5 mg via RESPIRATORY_TRACT
  Filled 2016-12-08: qty 6

## 2016-12-08 MED ORDER — PREDNISONE 20 MG PO TABS
60.0000 mg | ORAL_TABLET | Freq: Once | ORAL | Status: AC
  Administered 2016-12-08: 60 mg via ORAL
  Filled 2016-12-08: qty 3

## 2016-12-08 MED ORDER — ALBUTEROL SULFATE HFA 108 (90 BASE) MCG/ACT IN AERS
2.0000 | INHALATION_SPRAY | RESPIRATORY_TRACT | Status: DC | PRN
  Administered 2016-12-08: 2 via RESPIRATORY_TRACT
  Filled 2016-12-08: qty 6.7

## 2016-12-08 MED ORDER — PREDNISONE 20 MG PO TABS: 40.0000 mg | ORAL_TABLET | Freq: Every day | ORAL | 0 refills | Status: DC

## 2016-12-08 NOTE — ED Triage Notes (Signed)
Patient states she has an history of asthma.  He started coughing up blood 3 days ago.  He states he is out of his medication. Has some SOB and cough

## 2016-12-08 NOTE — ED Provider Notes (Signed)
[facility]-EMERGENCY DEPT Provider Note   CSN: [id number] Arrival date & time: 12/08/16  0807     History   Chief Complaint Chief Complaint  Patient presents with  . Cough  . Asthma    HPI Cameron Greene is a 31 y.o. male.  The history is provided by the patient.  Cough  This is a recurrent problem. Episode onset: 3 days ago. The problem occurs constantly. The problem has been gradually worsening. The cough is non-productive (on sat had a few streaks of blood but no other sputum and no further blood). There has been no fever. Associated symptoms include shortness of breath and wheezing. Pertinent negatives include no chest pain, no sweats, no ear congestion, no ear pain, no rhinorrhea and no sore throat. He has tried nothing for the symptoms. The treatment provided no relief. He is a smoker. His past medical history is significant for asthma.  Asthma  Associated symptoms include shortness of breath. Pertinent negatives include no chest pain.    Past Medical History:  Diagnosis Date  . Asthma     There are no active problems to display for this patient.   Past Surgical History:  Procedure Laterality Date  . KNEE SURGERY    . KNEE SURGERY         Home Medications    Prior to Admission medications   Medication Sig Start Date End Date Taking? Authorizing Provider  albuterol (PROVENTIL HFA;VENTOLIN HFA) 108 (90 Base) MCG/ACT inhaler Inhale 1-2 puffs into the lungs every 6 (six) hours as needed for wheezing or shortness of breath.   Yes Historical Provider, MD  HYDROcodone-acetaminophen (NORCO) 5-325 MG per tablet Take 1-2 tablets by mouth every 4 (four) hours as needed for pain. Patient not taking: Reported on 12/08/2016 07/01/13   Blane Ohara, MD    Family History Family History  Problem Relation Age of Onset  . Hypertension Mother   . Asthma Mother     Social History Social History  Substance Use Topics  . Smoking status: Current Every Day Smoker  . Smokeless  tobacco: Current User  . Alcohol use Yes     Comment: occasonal     Allergies   Bee venom; Fish allergy; Apple; and Banana   Review of Systems Review of Systems  HENT: Negative for ear pain, rhinorrhea and sore throat.   Respiratory: Positive for cough, shortness of breath and wheezing.   Cardiovascular: Negative for chest pain.  All other systems reviewed and are negative.    Physical Exam Updated Vital Signs BP 152/80 (BP Location: Left Arm)   Pulse 85   Temp 98.5 F (36.9 C) (Oral)   Resp 18   Ht 6' (1.829 m)   Wt 300 lb (136.1 kg)   SpO2 97%   BMI 40.69 kg/m   Physical Exam  Constitutional: He is oriented to person, place, and time. He appears well-developed and well-nourished. No distress.  HENT:  Head: Normocephalic and atraumatic.  Right Ear: Tympanic membrane normal.  Left Ear: Tympanic membrane normal.  Mouth/Throat: Oropharynx is clear and moist. No oropharyngeal exudate, posterior oropharyngeal edema or posterior oropharyngeal erythema.  Eyes: Conjunctivae and EOM are normal. Pupils are equal, round, and reactive to light.  Neck: Normal range of motion. Neck supple.  Cardiovascular: Normal rate, regular rhythm and intact distal pulses.   No murmur heard. Pulmonary/Chest: Effort normal. No respiratory distress. He has decreased breath sounds. He has wheezes. He has no rales.  Abdominal: Soft. He exhibits no distension.  There is no tenderness. There is no rebound and no guarding.  Musculoskeletal: Normal range of motion. He exhibits no edema or tenderness.  Neurological: He is alert and oriented to person, place, and time.  Skin: Skin is warm and dry. No rash noted. No erythema.  Psychiatric: He has a normal mood and affect. His behavior is normal.  Nursing note and vitals reviewed.    ED Treatments / Results  Labs (all labs ordered are listed, but only abnormal results are displayed) Labs Reviewed - No data to display  EKG  EKG  Interpretation  Date/Time:  Tuesday December 08 2016 08:47:39 EST Ventricular Rate:  73 PR Interval:    QRS Duration: 113 QT Interval:  368 QTC Calculation: 406 R Axis:   6 Text Interpretation:  Sinus rhythm Incomplete right bundle branch block ST elevation suggests acute pericarditis No previous tracing Confirmed by Anitra LauthPLUNKETT  MD, Alphonzo LemmingsWHITNEY ([id number]) on 12/08/2016 9:25:02 AM       Radiology Dg Chest 2 View  Result Date: 12/08/2016 CLINICAL DATA:  Worsening cough and shortness of breath for 3 days. Patient reports history of asthma. EXAM: CHEST  2 VIEW COMPARISON:  04/17/2012 and 10/08/2011 FINDINGS: The lung volumes are low-normal. Heart size is within normal limits. Mediastinal and hilar contours are normal. Pulmonary vascularity is normal. Diffuse mild peribronchial thickening noted. No focal airspace disease or pleural effusion. Negative for pneumothorax. Trachea midline. No acute osseous abnormality. IMPRESSION: Diffuse mild peribronchial thickening. This can be seen in the setting of asthma, smoking, or infectious bronchitis. No focal airspace disease identified. Electronically Signed   By: Britta MccreedySusan  Turner M.D.   On: 12/08/2016 09:21    Procedures Procedures (including critical care time)  Medications Ordered in ED Medications  albuterol (PROVENTIL HFA;VENTOLIN HFA) 108 (90 Base) MCG/ACT inhaler 2 puff (not administered)  albuterol (PROVENTIL) (2.5 MG/3ML) 0.083% nebulizer solution 5 mg (5 mg Nebulization Given 12/08/16 0919)  albuterol (PROVENTIL) (2.5 MG/3ML) 0.083% nebulizer solution 5 mg (5 mg Nebulization Given 12/08/16 1036)  predniSONE (DELTASONE) tablet 60 mg (60 mg Oral Given 12/08/16 1035)  ipratropium (ATROVENT) nebulizer solution 0.5 mg (0.5 mg Nebulization Given 12/08/16 1036)     Initial Impression / Assessment and Plan / ED Course  I have reviewed the triage vital signs and the nursing notes.  Pertinent labs & imaging results that were available during my care of the  patient were reviewed by me and considered in my medical decision making (see chart for details).     Pt with typical asthma exacerbation  Symptoms and he has ran out of his inhaler.  No infectious sx, productive cough or other complaints.  No c/o of flu like sx.  Wheezing on exam.  CXR without signs of PNA and EKG with early repol.  Will give steroids, albuterol/atrovent and recheck.  11:09 AM Pt feeling better after nebs.  Will d/c home with inhaler and prednisone.  Final Clinical Impressions(s) / ED Diagnoses   Final diagnoses:  Exacerbation of persistent asthma, unspecified asthma severity    New Prescriptions New Prescriptions   PREDNISONE (DELTASONE) 20 MG TABLET    Take 2 tablets (40 mg total) by mouth daily.     Gwyneth SproutWhitney Kejuan Bekker, MD 12/08/16 1110

## 2016-12-08 NOTE — ED Notes (Signed)
Esignature not available. 

## 2017-06-21 ENCOUNTER — Emergency Department (HOSPITAL_COMMUNITY): Payer: Self-pay

## 2017-06-21 ENCOUNTER — Emergency Department (HOSPITAL_COMMUNITY)
Admission: EM | Admit: 2017-06-21 | Discharge: 2017-06-21 | Disposition: A | Discharge status: Home / Self Care | Payer: Self-pay | Attending: Emergency Medicine | Admitting: Emergency Medicine

## 2017-06-21 ENCOUNTER — Encounter (HOSPITAL_COMMUNITY): Payer: Self-pay | Admitting: Emergency Medicine

## 2017-06-21 DIAGNOSIS — X509XXA Other and unspecified overexertion or strenuous movements or postures, initial encounter: Secondary | ICD-10-CM | POA: Insufficient documentation

## 2017-06-21 DIAGNOSIS — Z79899 Other long term (current) drug therapy: Secondary | ICD-10-CM | POA: Insufficient documentation

## 2017-06-21 DIAGNOSIS — Y999 Unspecified external cause status: Secondary | ICD-10-CM | POA: Insufficient documentation

## 2017-06-21 DIAGNOSIS — Y929 Unspecified place or not applicable: Secondary | ICD-10-CM | POA: Insufficient documentation

## 2017-06-21 DIAGNOSIS — Y939 Activity, unspecified: Secondary | ICD-10-CM | POA: Insufficient documentation

## 2017-06-21 DIAGNOSIS — F17200 Nicotine dependence, unspecified, uncomplicated: Secondary | ICD-10-CM | POA: Insufficient documentation

## 2017-06-21 DIAGNOSIS — M5431 Sciatica, right side: Secondary | ICD-10-CM | POA: Insufficient documentation

## 2017-06-21 DIAGNOSIS — J45909 Unspecified asthma, uncomplicated: Secondary | ICD-10-CM | POA: Insufficient documentation

## 2017-06-21 DIAGNOSIS — S39012A Strain of muscle, fascia and tendon of lower back, initial encounter: Secondary | ICD-10-CM | POA: Insufficient documentation

## 2017-06-21 MED ORDER — HYDROCODONE-ACETAMINOPHEN 5-325 MG PO TABS
1.0000 | ORAL_TABLET | Freq: Once | ORAL | Status: AC
  Administered 2017-06-21: 1 via ORAL
  Filled 2017-06-21: qty 1

## 2017-06-21 MED ORDER — DICLOFENAC SODIUM 50 MG PO TBEC: 50.0000 mg | DELAYED_RELEASE_TABLET | Freq: Two times a day (BID) | ORAL | 0 refills | Status: DC

## 2017-06-21 MED ORDER — DIAZEPAM 5 MG PO TABS
5.0000 mg | ORAL_TABLET | Freq: Once | ORAL | Status: AC
  Administered 2017-06-21: 5 mg via ORAL
  Filled 2017-06-21: qty 1

## 2017-06-21 MED ORDER — CYCLOBENZAPRINE HCL 10 MG PO TABS: 10.0000 mg | ORAL_TABLET | Freq: Two times a day (BID) | ORAL | 0 refills | Status: DC | PRN

## 2017-06-21 NOTE — ED Notes (Signed)
Pt ambulatory and independent at discharge.  Verbalized understanding of discharge instructions 

## 2017-06-21 NOTE — ED Triage Notes (Signed)
Pt states that he woke up with lower back pain. Worse with movement. 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 Alert and oriented.

## 2017-06-21 NOTE — ED Provider Notes (Signed)
[facility]-EMERG[facility]Y DEPT Provider Note   CSN: [id number] Arrival date & time: 06/21/17  1658  By signing my name below, I, Deland Pretty, attest that this documentation has been prepared under the direction and in the presence of Kerrie Buffalo, NP Electronically Signed: Deland Pretty, ED Scribe. 06/21/17. 6:33 PM.  History   Chief Complaint Chief Complaint  Patient presents with  . Back Pain   The history is provided by the patient. No language interpreter was used.  Back Pain   This is a new problem. The current episode started 2 days ago. The problem occurs constantly. The problem has not changed since onset.The pain is associated with no known injury. The pain is present in the lumbar spine. The pain radiates to the right thigh and right knee. The pain is moderate. Exacerbated by: walking. Associated symptoms include numbness (tingling right leg). Pertinent negatives include no chest pain, no fever, no headaches and no abdominal pain. He has tried nothing for the symptoms.   HPI Comments: Cameron Greene is a 31 y.o. male who presents to the Emergency Department complaining of a sudden onset of gradually worsening lower back pain that began 2 days ago. He denies recent fall and injury. The pt states that his pain radiates down his right leg. Movement exacerbates his pain. The pt has not tried any medications or treatment. He states that he has had muscle spasms in his back that feel dissimilar to his current symptoms. The pt denies chills, fever, nausea, emesis, headaches, neck pain, and neck stiffness.   Past Medical History:  Diagnosis Date  . Asthma     There are no active problems to display for this patient.   Past Surgical History:  Procedure Laterality Date  . KNEE SURGERY    . KNEE SURGERY         Home Medications    Prior to Admission medications   Medication Sig Start Date End Date Taking? Authorizing Provider  albuterol (PROVENTIL HFA;VENTOLIN HFA) 108 (90  Base) MCG/ACT inhaler Inhale 1-2 puffs into the lungs every 6 (six) hours as needed for wheezing or shortness of breath.    [provider]  cyclobenzaprine (FLEXERIL) 10 MG tablet Take 1 tablet (10 mg total) by mouth 2 (two) times daily as needed for muscle spasms. 06/21/17   Janne Napoleon, NP  diclofenac (VOLTAREN) 50 MG EC tablet Take 1 tablet (50 mg total) by mouth 2 (two) times daily. 06/21/17   Janne Napoleon, NP  HYDROcodone-acetaminophen (NORCO) 5-325 MG per tablet Take 1-2 tablets by mouth every 4 (four) hours as needed for pain. Patient not taking: Reported on 12/08/2016 07/01/13   Blane Ohara, MD  predniSONE (DELTASONE) 20 MG tablet Take 2 tablets (40 mg total) by mouth daily. 12/08/16   Gwyneth Sprout, MD    Family History Family History  Problem Relation Age of Onset  . Hypertension Mother   . Asthma Mother     Social History Social History  Substance Use Topics  . Smoking status: Current Every Day Smoker  . Smokeless tobacco: Current User  . Alcohol use Yes     Comment: occasonal     Allergies   Bee venom; Fish allergy; Apple; and Banana   Review of Systems Review of Systems  Constitutional: Negative for chills and fever.  HENT: Negative.   Respiratory: Negative for chest tightness and shortness of breath.   Cardiovascular: Negative for chest pain.  Gastrointestinal: Negative for abdominal pain, nausea and vomiting.  No bowel incontinence.  Genitourinary:       No bowel incontinence.  Musculoskeletal: Positive for back pain. Negative for neck pain and neck stiffness.  Skin: Negative for rash and wound.  Neurological: Positive for numbness (tingling right leg). Negative for headaches.  Psychiatric/Behavioral: The patient is not nervous/anxious.      Physical Exam Updated Vital Signs BP (!) 145/99 (BP Location: Left Arm)   Pulse 62   Temp 98.1 F (36.7 C) (Oral)   Resp 18   SpO2 97%   Physical Exam  Constitutional: No distress.    Morbidly obese  HENT:  Head: Normocephalic and atraumatic.  Eyes: Conjunctivae and EOM are normal.  Neck: Normal range of motion. Neck supple.  Cardiovascular: Normal rate, regular rhythm and intact distal pulses.   Pulses:      Radial pulses are 2+ on the right side, and 2+ on the left side.  Pulmonary/Chest: Effort normal and breath sounds normal.  No CVA tenderness.  Abdominal: Soft. There is no tenderness.  Musculoskeletal: Normal range of motion. He exhibits tenderness. He exhibits no edema.       Lumbar back: He exhibits tenderness and spasm. He exhibits no deformity and normal pulse. Decreased range of motion: due to pain.  Tenderness over lumbar spine. Grips are equal.  Neurological: He is alert. He has normal strength. No sensory deficit. Gait normal.  Reflex Scores:      Bicep reflexes are 2+ on the right side and 2+ on the left side.      Brachioradialis reflexes are 2+ on the right side and 2+ on the left side.      Patellar reflexes are 2+ on the right side and 2+ on the left side. Skin: Skin is warm and dry.  Psychiatric: He has a normal mood and affect. His behavior is normal.  Nursing note and vitals reviewed.    ED Treatments / Results   DIAGNOSTIC STUDIES: Oxygen Saturation is 97% on RA, normal by my interpretation.   COORDINATION OF CARE: 6:31 PM-Discussed next steps with pt. Pt verbalized understanding and is agreeable with the plan.   Labs (all labs ordered are listed, but only abnormal results are displayed) Labs Reviewed - No data to display Radiology Dg Lumbar Spine Complete  Result Date: 06/21/2017 CLINICAL DATA:  Low back pain radiating into right lower extremity. EXAM: LUMBAR SPINE - COMPLETE 4+ VIEW COMPARISON:  None. FINDINGS: There is no evidence of lumbar spine fracture. Alignment is normal without listhesis or subluxation. Minimal disc space narrowing present at L4-5 mild disc space narrowing at L5-S1. No bony lesions. IMPRESSION: Mild  degenerative disc disease at L4-5 and L5-S1. Electronically Signed   By: Irish Lack M.D.   On: 06/21/2017 19:29    Procedures Procedures (including critical care time)  Medications Ordered in ED Medications  diazepam (VALIUM) tablet 5 mg (5 mg Oral Given 06/21/17 1919)  HYDROcodone-acetaminophen (NORCO/VICODIN) 5-325 MG per tablet 1 tablet (1 tablet Oral Given 06/21/17 1919)     Initial Impression / Assessment and Plan / ED Course  I have reviewed the triage vital signs and the nursing notes.  Patient with back pain.  No neurological deficits and normal neuro exam.  Patient is ambulatory.  No loss of bowel or bladder control.  No concern for cauda equina.  No fever, night sweats, weight loss, h/o cancer, IVDA, no recent procedure to back. No urinary symptoms suggestive of UTI.  Supportive care and return precaution discussed. Appears safe for discharge  at this time. Follow up as indicated in discharge paperwork.   Final Clinical Impressions(s) / ED Diagnoses   Final diagnoses:  Strain of lumbar region, initial encounter  Sciatica, right side    New Prescriptions Discharge Medication List as of 06/21/2017  7:43 PM    START taking these medications   Details  cyclobenzaprine (FLEXERIL) 10 MG tablet Take 1 tablet (10 mg total) by mouth 2 (two) times daily as needed for muscle spasms., Starting Mon 06/21/2017, Print    diclofenac (VOLTAREN) 50 MG EC tablet Take 1 tablet (50 mg total) by mouth 2 (two) times daily., Starting Mon 06/21/2017, Print      I personally performed the services described in this documentation, which was scribed in my presence. The recorded information has been reviewed and is accurate.    Kerrie Buffaloeese, Amyiah Gaba [place], [place] 06/21/17 2252    Cathren LaineSteinl, Kevin, MD 06/25/17 1045

## 2017-06-21 NOTE — Discharge Instructions (Signed)
Do not take the muscle relaxant if driving as it can make you sleepy. Follow up with Dr. Ophelia CharterYates. Return here as needed.

## 2017-10-16 ENCOUNTER — Emergency Department (HOSPITAL_COMMUNITY)
Admission: EM | Admit: 2017-10-16 | Discharge: 2017-10-16 | Disposition: A | Discharge status: Home / Self Care | Payer: Self-pay | Attending: Emergency Medicine | Admitting: Emergency Medicine

## 2017-10-16 ENCOUNTER — Encounter (HOSPITAL_COMMUNITY): Payer: Self-pay | Admitting: Emergency Medicine

## 2017-10-16 DIAGNOSIS — F17210 Nicotine dependence, cigarettes, uncomplicated: Secondary | ICD-10-CM | POA: Insufficient documentation

## 2017-10-16 DIAGNOSIS — R369 Urethral discharge, unspecified: Secondary | ICD-10-CM | POA: Insufficient documentation

## 2017-10-16 DIAGNOSIS — Z202 Contact with and (suspected) exposure to infections with a predominantly sexual mode of transmission: Secondary | ICD-10-CM | POA: Insufficient documentation

## 2017-10-16 DIAGNOSIS — J45909 Unspecified asthma, uncomplicated: Secondary | ICD-10-CM | POA: Insufficient documentation

## 2017-10-16 DIAGNOSIS — Z79899 Other long term (current) drug therapy: Secondary | ICD-10-CM | POA: Insufficient documentation

## 2017-10-16 LAB — URINALYSIS, ROUTINE W REFLEX MICROSCOPIC
BILIRUBIN URINE: NEGATIVE
GLUCOSE, UA: NEGATIVE mg/dL
KETONES UR: NEGATIVE mg/dL
LEUKOCYTES UA: NEGATIVE
NITRITE: NEGATIVE
PH: 5 (ref 5.0–8.0)
Protein, ur: NEGATIVE mg/dL
Specific Gravity, Urine: 1.015 (ref 1.005–1.030)

## 2017-10-16 MED ORDER — STERILE WATER FOR INJECTION IJ SOLN
INTRAMUSCULAR | Status: AC
  Administered 2017-10-16: 10 mL
  Filled 2017-10-16: qty 10

## 2017-10-16 MED ORDER — AZITHROMYCIN 250 MG PO TABS
1000.0000 mg | ORAL_TABLET | Freq: Once | ORAL | Status: AC
  Administered 2017-10-16: 1000 mg via ORAL
  Filled 2017-10-16: qty 4

## 2017-10-16 MED ORDER — CEFTRIAXONE SODIUM 250 MG IJ SOLR
250.0000 mg | Freq: Once | INTRAMUSCULAR | Status: AC
  Administered 2017-10-16: 250 mg via INTRAMUSCULAR
  Filled 2017-10-16: qty 250

## 2017-10-16 MED ORDER — METRONIDAZOLE 500 MG PO TABS
2000.0000 mg | ORAL_TABLET | Freq: Once | ORAL | Status: AC
  Administered 2017-10-16: 2000 mg via ORAL
  Filled 2017-10-16: qty 4

## 2017-10-16 MED ORDER — ONDANSETRON 4 MG PO TBDP
4.0000 mg | ORAL_TABLET | Freq: Once | ORAL | Status: AC
  Administered 2017-10-16: 4 mg via ORAL
  Filled 2017-10-16: qty 1

## 2017-10-16 NOTE — ED Notes (Signed)
Bed: WTR7 Expected date:  Expected time:  Means of arrival:  Comments: 

## 2017-10-16 NOTE — ED Provider Notes (Signed)
[facility]-EMERGENCY DEPT Provider Note   CSN: [id number] Arrival date & time: 10/16/17  1106     History   Chief Complaint Chief Complaint  Patient presents with  . Penile Discharge    HPI  Cameron Greene is a 31 y.o. Male with a history of asthma, presents with some burning and itching after urination, and known exposure to Trichomonas. Patient reports he was told earlier this week by one of his partners that she was diagnosed with Trichomonas, he reports she had other STD testing which was all negative. Patient reports occasional clear penile discharge, and some burning and itching after urinating, but denies any other symptoms. Denies any genital lesions. Patient denies abdominal pain, rectal pain or pain with defecation. No redness, swelling or pain of the testicles or scrotum. Patient does report some constipation, but denies any other symptoms. No fevers or chills. Patient reports he's not had any STIs in the past, and would like full testing.       Past Medical History:  Diagnosis Date  . Asthma     There are no active problems to display for this patient.   Past Surgical History:  Procedure Laterality Date  . KNEE SURGERY    . KNEE SURGERY         Home Medications    Prior to Admission medications   Medication Sig Start Date End Date Taking? Authorizing Provider  albuterol (PROVENTIL HFA;VENTOLIN HFA) 108 (90 Base) MCG/ACT inhaler Inhale 1-2 puffs into the lungs every 6 (six) hours as needed for wheezing or shortness of breath.    [provider]  cyclobenzaprine (FLEXERIL) 10 MG tablet Take 1 tablet (10 mg total) by mouth 2 (two) times daily as needed for muscle spasms. 06/21/17   Janne NapoleonNeese, Hope M, NP  diclofenac (VOLTAREN) 50 MG EC tablet Take 1 tablet (50 mg total) by mouth 2 (two) times daily. 06/21/17   Janne NapoleonNeese, Hope M, NP  HYDROcodone-acetaminophen (NORCO) 5-325 MG per tablet Take 1-2 tablets by mouth every 4 (four) hours as  needed for pain. Patient not taking: Reported on 12/08/2016 07/01/13   Blane OharaZavitz, Joshua, MD  predniSONE (DELTASONE) 20 MG tablet Take 2 tablets (40 mg total) by mouth daily. 12/08/16   Gwyneth SproutPlunkett, Whitney, MD    Family History Family History  Problem Relation Age of Onset  . Hypertension Mother   . Asthma Mother     Social History Social History   Tobacco Use  . Smoking status: Current Every Day Smoker  . Smokeless tobacco: Current User  Substance Use Topics  . Alcohol use: Yes    Comment: occasonal  . Drug use: Yes    Types: Marijuana     Allergies   Bee venom; Fish allergy; Apple; and Banana   Review of Systems Review of Systems  Constitutional: Negative for chills and fever.  HENT: Negative for sore throat.   Gastrointestinal: Negative for abdominal pain, anal bleeding, constipation, diarrhea, nausea, rectal pain and vomiting.  Genitourinary: Positive for discharge and dysuria. Negative for flank pain, frequency, genital sores, penile pain, penile swelling, scrotal swelling, testicular pain and urgency.  Skin: Negative for rash.     Physical Exam Updated Vital Signs BP (!) 166/94   Pulse 87   Temp 98.5 F (36.9 C)   Resp (!) 22   SpO2 96%   Physical Exam  Constitutional: He appears well-developed and well-nourished. No distress.  HENT:  Head: Normocephalic and atraumatic.  Eyes: Right eye exhibits no discharge.  Left eye exhibits no discharge.  Pulmonary/Chest: Effort normal. No respiratory distress.  Abdominal: Soft. Bowel sounds are normal. He exhibits no distension. There is no tenderness. There is no guarding.  Genitourinary:  Genitourinary Comments: No genital lesions noted, penis nontender, small amount of clear discharge present at the urinary meatus, no erythema, swelling or tenderness of the testes or scrotum  Neurological: He is alert. Coordination normal.  Skin: Skin is warm and dry. Capillary refill takes less than 2 seconds. He is not diaphoretic.    Psychiatric: He has a normal mood and affect. His behavior is normal.  Nursing note and vitals reviewed.    ED Treatments / Results  Labs (all labs ordered are listed, but only abnormal results are displayed) Labs Reviewed  URINALYSIS, ROUTINE W REFLEX MICROSCOPIC - Abnormal; Notable for the following components:      Result Value   Hgb urine dipstick SMALL (*)    Bacteria, UA RARE (*)    Squamous Epithelial / LPF 0-5 (*)    All other components within normal limits  RPR  HIV ANTIBODY (ROUTINE TESTING)  GC/CHLAMYDIA PROBE AMP [facility]    EKG  EKG Interpretation None       Radiology No results found.  Procedures Procedures (including critical care time)  Medications Ordered in ED Medications  cefTRIAXone (ROCEPHIN) injection 250 mg (250 mg Intramuscular Given 10/16/17 1412)  azithromycin (ZITHROMAX) tablet 1,000 mg (1,000 mg Oral Given 10/16/17 1409)  metroNIDAZOLE (FLAGYL) tablet 2,000 mg (2,000 mg Oral Given 10/16/17 1410)  ondansetron (ZOFRAN-ODT) disintegrating tablet 4 mg (4 mg Oral Given 10/16/17 1407)  sterile water (preservative free) injection (10 mLs  Given 10/16/17 1416)     Initial Impression / Assessment and Plan / ED Course  I have reviewed the triage vital signs and the nursing notes.  Pertinent labs & imaging results that were available during my care of the patient were reviewed by me and considered in my medical decision making (see chart for details).  Patient is afebrile without abdominal tenderness, abdominal pain or painful bowel movements to indicate prostatitis.  No tenderness to palpation of the testes or epididymis to suggest orchitis or epididymitis.  STD cultures obtained including HIV, syphilis, gonorrhea and chlamydia. Patient to be discharged with instructions to follow up with department, [facility] also provided us patient does not have PCP. Discussed importance of using  protection when sexually active. Pt understands that they have GC/Chlamydia cultures pending and that they will need to inform all sexual partners if results return positive. Patient has been treated prophylactically with azithromycin and Rocephin, patient also treated for Trichomonas given known exposure.    Final Clinical Impressions(s) / ED Diagnoses   Final diagnoses:  Exposure to STD  Penile discharge    ED Discharge Orders    None       Legrand RamsFord, Ana Liaw N, PA-C 10/16/17 Ovidio [id number]    Shaune PollackIsaacs, Cameron, MD 10/17/17 [phone]

## 2017-10-16 NOTE — ED Triage Notes (Signed)
Patient here with complaints of exposure to STD. Reports that his sexual partner was diagnosed with trichomonas. Denies pain, but reports itching when peeing.

## 2017-10-16 NOTE — Discharge Instructions (Addendum)
Continue the use were treated for Trichomonas, you were also covered for gonorrhea and chlamydia. Lab tests for these conditions, as well as HIV and syphilis, have been sent and you'll be contacted in 2-3 days if any of these come back positive. Please use condoms for protection, and notify any partners if test results return positive. In the future you can always go to the health Department for STD concerns. Please call to schedule with the [facility] for routine follow-up as well. If you develop fevers or chills, abdominal pain, rectal pain or symptoms do not improve please return.

## 2017-10-16 NOTE — ED Notes (Signed)
Pt verbalizes understanding of d/c paperwork, follow up instructions. Pt A/O x4, ambulatory. All belongings with patient upon departure.  

## 2017-10-17 LAB — RPR: RPR: NONREACTIVE

## 2017-10-17 LAB — HIV ANTIBODY (ROUTINE TESTING W REFLEX): HIV SCREEN 4TH GENERATION: NONREACTIVE

## 2017-10-19 LAB — GC/CHLAMYDIA PROBE AMP (~~LOC~~) NOT AT ARMC
CHLAMYDIA, DNA PROBE: NEGATIVE
NEISSERIA GONORRHEA: NEGATIVE

## 2018-02-02 ENCOUNTER — Other Ambulatory Visit: Payer: Self-pay

## 2018-02-02 ENCOUNTER — Ambulatory Visit (HOSPITAL_COMMUNITY): Admission: EM | Admit: 2018-02-02 | Payer: Self-pay | Source: Ambulatory Visit | Admitting: Cardiovascular Disease

## 2018-02-02 ENCOUNTER — Encounter (HOSPITAL_COMMUNITY): Payer: Self-pay | Admitting: General Practice

## 2018-02-02 ENCOUNTER — Observation Stay (HOSPITAL_COMMUNITY)
Admission: EM | Admit: 2018-02-02 | Discharge: 2018-02-03 | Disposition: A | Discharge status: Home / Self Care | Payer: Self-pay | Source: Other Acute Inpatient Hospital | Attending: Cardiology | Admitting: Cardiology

## 2018-02-02 ENCOUNTER — Ambulatory Visit (HOSPITAL_COMMUNITY)
Admission: EM | Disposition: A | Discharge status: Home / Self Care | Payer: Self-pay | Source: Other Acute Inpatient Hospital | Attending: Cardiology

## 2018-02-02 DIAGNOSIS — F17210 Nicotine dependence, cigarettes, uncomplicated: Secondary | ICD-10-CM | POA: Insufficient documentation

## 2018-02-02 DIAGNOSIS — Z7952 Long term (current) use of systemic steroids: Secondary | ICD-10-CM | POA: Insufficient documentation

## 2018-02-02 DIAGNOSIS — Z6841 Body Mass Index (BMI) 40.0 and over, adult: Secondary | ICD-10-CM | POA: Insufficient documentation

## 2018-02-02 DIAGNOSIS — I10 Essential (primary) hypertension: Secondary | ICD-10-CM | POA: Insufficient documentation

## 2018-02-02 DIAGNOSIS — J45909 Unspecified asthma, uncomplicated: Secondary | ICD-10-CM | POA: Insufficient documentation

## 2018-02-02 DIAGNOSIS — F1490 Cocaine use, unspecified, uncomplicated: Secondary | ICD-10-CM | POA: Insufficient documentation

## 2018-02-02 DIAGNOSIS — E669 Obesity, unspecified: Secondary | ICD-10-CM | POA: Insufficient documentation

## 2018-02-02 DIAGNOSIS — F1290 Cannabis use, unspecified, uncomplicated: Secondary | ICD-10-CM | POA: Insufficient documentation

## 2018-02-02 DIAGNOSIS — R072 Precordial pain: Principal | ICD-10-CM | POA: Insufficient documentation

## 2018-02-02 HISTORY — DX: Essential (primary) hypertension: I10

## 2018-02-02 HISTORY — PX: LEFT HEART CATH AND CORONARY ANGIOGRAPHY: CATH118249

## 2018-02-02 HISTORY — PX: CARDIAC CATHETERIZATION: SHX172

## 2018-02-02 HISTORY — DX: Unspecified osteoarthritis, unspecified site: M19.90

## 2018-02-02 HISTORY — DX: Gout, unspecified: M10.9

## 2018-02-02 LAB — COMPREHENSIVE METABOLIC PANEL
ALK PHOS: 105 U/L (ref 38–126)
ALT: 43 U/L (ref 17–63)
AST: 27 U/L (ref 15–41)
Albumin: 3.9 g/dL (ref 3.5–5.0)
Anion gap: 10 (ref 5–15)
BILIRUBIN TOTAL: 0.6 mg/dL (ref 0.3–1.2)
BUN: 16 mg/dL (ref 6–20)
CALCIUM: 9.1 mg/dL (ref 8.9–10.3)
CO2: 22 mmol/L (ref 22–32)
CREATININE: 1.22 mg/dL (ref 0.61–1.24)
Chloride: 106 mmol/L (ref 101–111)
GFR calc Af Amer: >60 mL/min (ref >60)
Glucose, Bld: 109 mg/dL — ABNORMAL HIGH (ref 65–99)
POTASSIUM: 3.7 mmol/L (ref 3.5–5.1)
Sodium: 138 mmol/L (ref 135–145)
TOTAL PROTEIN: 6.9 g/dL (ref 6.5–8.1)

## 2018-02-02 LAB — CBC
HEMATOCRIT: 38.4 % — AB (ref 39.0–52.0)
HEMOGLOBIN: 13.4 g/dL (ref 13.0–17.0)
MCH: 30.4 pg (ref 26.0–34.0)
MCHC: 34.9 g/dL (ref 30.0–36.0)
MCV: 87.1 fL (ref 78.0–100.0)
Platelets: 182 10*3/uL (ref 150–400)
RBC: 4.41 MIL/uL (ref 4.22–5.81)
RDW: 12.1 % (ref 11.5–15.5)
WBC: 8.5 10*3/uL (ref 4.0–10.5)

## 2018-02-02 LAB — PROTIME-INR
INR: 0.98
PROTHROMBIN TIME: 12.9 s (ref 11.4–15.2)

## 2018-02-02 LAB — LIPID PANEL
CHOLESTEROL: 123 mg/dL (ref 0–200)
HDL: 33 mg/dL — ABNORMAL LOW (ref >40)
LDL Cholesterol: 73 mg/dL (ref 0–99)
Total CHOL/HDL Ratio: 3.7 RATIO
Triglycerides: 83 mg/dL (ref <150)
VLDL: 17 mg/dL (ref 0–40)

## 2018-02-02 LAB — POCT I-STAT, CHEM 8
BUN: 19 mg/dL (ref 6–20)
Calcium, Ion: 1.27 mmol/L (ref 1.15–1.40)
Chloride: 104 mmol/L (ref 101–111)
Creatinine, Ser: 1.2 mg/dL (ref 0.61–1.24)
Glucose, Bld: 111 mg/dL — ABNORMAL HIGH (ref 65–99)
HEMATOCRIT: 38 % — AB (ref 39.0–52.0)
HEMOGLOBIN: 12.9 g/dL — AB (ref 13.0–17.0)
Potassium: 3.7 mmol/L (ref 3.5–5.1)
SODIUM: 142 mmol/L (ref 135–145)
TCO2: 24 mmol/L (ref 22–32)

## 2018-02-02 LAB — HEMOGLOBIN A1C
HEMOGLOBIN A1C: 4.1 % — AB (ref 4.8–5.6)
Mean Plasma Glucose: 70.97 mg/dL

## 2018-02-02 LAB — TROPONIN I: Troponin I: <0.03 ng/mL (ref <0.03)

## 2018-02-02 LAB — APTT: APTT: 33 s (ref 24–36)

## 2018-02-02 SURGERY — LEFT HEART CATH AND CORONARY ANGIOGRAPHY
Anesthesia: LOCAL

## 2018-02-02 MED ORDER — ONDANSETRON HCL 4 MG/2ML IJ SOLN: 4.0000 mg | Freq: Four times a day (QID) | INTRAMUSCULAR | Status: DC | PRN

## 2018-02-02 MED ORDER — SODIUM CHLORIDE 0.9% FLUSH
3.0000 mL | Freq: Two times a day (BID) | INTRAVENOUS | Status: DC
  Administered 2018-02-03: 3 mL via INTRAVENOUS

## 2018-02-02 MED ORDER — HEPARIN (PORCINE) IN NACL 2-0.9 UNIT/ML-% IJ SOLN
INTRAMUSCULAR | Status: DC | PRN
  Administered 2018-02-02: 10 mL via INTRA_ARTERIAL

## 2018-02-02 MED ORDER — SODIUM CHLORIDE 0.9 % IV SOLN: 250.0000 mL | INTRAVENOUS | Status: DC | PRN

## 2018-02-02 MED ORDER — IOPAMIDOL (ISOVUE-370) INJECTION 76%
INTRAVENOUS | Status: DC | PRN
  Administered 2018-02-02: 115 mL via INTRA_ARTERIAL

## 2018-02-02 MED ORDER — HEPARIN (PORCINE) IN NACL 2-0.9 UNIT/ML-% IJ SOLN
INTRAMUSCULAR | Status: AC | PRN
  Administered 2018-02-02 (×2): 500 mL

## 2018-02-02 MED ORDER — LIDOCAINE HCL (PF) 1 % IJ SOLN
INTRAMUSCULAR | Status: AC
  Filled 2018-02-02: qty 30

## 2018-02-02 MED ORDER — HEPARIN SODIUM (PORCINE) 5000 UNIT/ML IJ SOLN
5000.0000 [IU] | Freq: Three times a day (TID) | INTRAMUSCULAR | Status: DC
  Administered 2018-02-02 – 2018-02-03 (×2): 5000 [IU] via SUBCUTANEOUS
  Filled 2018-02-02 (×2): qty 1

## 2018-02-02 MED ORDER — ACETAMINOPHEN 325 MG PO TABS: 650.0000 mg | ORAL_TABLET | ORAL | Status: DC | PRN

## 2018-02-02 MED ORDER — HYDRALAZINE HCL 20 MG/ML IJ SOLN
10.0000 mg | Freq: Four times a day (QID) | INTRAMUSCULAR | Status: DC | PRN
  Administered 2018-02-02: 10 mg via INTRAVENOUS

## 2018-02-02 MED ORDER — LIDOCAINE HCL (PF) 1 % IJ SOLN
INTRAMUSCULAR | Status: DC | PRN
  Administered 2018-02-02: 2 mL via INTRADERMAL

## 2018-02-02 MED ORDER — HEPARIN (PORCINE) IN NACL 2-0.9 UNIT/ML-% IJ SOLN
INTRAMUSCULAR | Status: AC
  Filled 2018-02-02: qty 1000

## 2018-02-02 MED ORDER — VERAPAMIL HCL 2.5 MG/ML IV SOLN
INTRAVENOUS | Status: AC
  Filled 2018-02-02: qty 2

## 2018-02-02 MED ORDER — IOPAMIDOL (ISOVUE-370) INJECTION 76%
INTRAVENOUS | Status: AC
  Filled 2018-02-02: qty 125

## 2018-02-02 MED ORDER — HYDRALAZINE HCL 20 MG/ML IJ SOLN
INTRAMUSCULAR | Status: AC
  Filled 2018-02-02: qty 1

## 2018-02-02 MED ORDER — PANTOPRAZOLE SODIUM 40 MG PO TBEC
40.0000 mg | DELAYED_RELEASE_TABLET | Freq: Every day | ORAL | Status: DC
  Administered 2018-02-02 – 2018-02-03 (×2): 40 mg via ORAL
  Filled 2018-02-02 (×2): qty 1

## 2018-02-02 MED ORDER — ALBUTEROL SULFATE (2.5 MG/3ML) 0.083% IN NEBU: 2.5000 mg | INHALATION_SOLUTION | Freq: Four times a day (QID) | RESPIRATORY_TRACT | Status: DC | PRN

## 2018-02-02 MED ORDER — NITROGLYCERIN 1 MG/10 ML FOR IR/CATH LAB
INTRA_ARTERIAL | Status: AC
  Filled 2018-02-02: qty 10

## 2018-02-02 MED ORDER — SODIUM CHLORIDE 0.9 % IV SOLN
INTRAVENOUS | Status: AC
  Administered 2018-02-02: 19:00:00 via INTRAVENOUS

## 2018-02-02 MED ORDER — HEPARIN SODIUM (PORCINE) 1000 UNIT/ML IJ SOLN
INTRAMUSCULAR | Status: DC | PRN
  Administered 2018-02-02: 6000 [IU] via INTRAVENOUS

## 2018-02-02 MED ORDER — SODIUM CHLORIDE 0.9% FLUSH: 3.0000 mL | INTRAVENOUS | Status: DC | PRN

## 2018-02-02 MED ORDER — HEPARIN SODIUM (PORCINE) 1000 UNIT/ML IJ SOLN
INTRAMUSCULAR | Status: AC
  Filled 2018-02-02: qty 1

## 2018-02-02 MED ORDER — AMLODIPINE BESYLATE 10 MG PO TABS
10.0000 mg | ORAL_TABLET | Freq: Every day | ORAL | Status: DC
  Administered 2018-02-02 – 2018-02-03 (×2): 10 mg via ORAL
  Filled 2018-02-02: qty 1

## 2018-02-02 MED ORDER — AMLODIPINE BESYLATE 5 MG PO TABS
ORAL_TABLET | ORAL | Status: AC
  Filled 2018-02-02: qty 2

## 2018-02-02 SURGICAL SUPPLY — 15 items
BAND ZEPHYR COMPRESS 30 LONG (HEMOSTASIS) ×2 IMPLANT
CATH INFINITI 5FR ANG PIGTAIL (CATHETERS) ×2 IMPLANT
CATH INFINITI JR4 5F (CATHETERS) ×2 IMPLANT
CATH VISTA GUIDE 6FR XBLAD3.5 (CATHETERS) ×2 IMPLANT
ELECT DEFIB PAD ADLT CADENCE (PAD) ×2 IMPLANT
GUIDEWIRE INQWIRE 1.5J.035X260 (WIRE) ×1 IMPLANT
INQWIRE 1.5J .035X260CM (WIRE) ×2
KIT ENCORE 26 ADVANTAGE (KITS) ×2 IMPLANT
KIT HEART LEFT (KITS) ×2 IMPLANT
NEEDLE PERC 18GX4CM (NEEDLE) ×2 IMPLANT
PACK CARDIAC CATHETERIZATION (CUSTOM PROCEDURE TRAY) ×2 IMPLANT
SHEATH RAIN RADIAL 21G 6FR (SHEATH) ×2 IMPLANT
SYR MEDRAD MARK V 150ML (SYRINGE) ×2 IMPLANT
TRANSDUCER W/STOPCOCK (MISCELLANEOUS) ×2 IMPLANT
TUBING CIL FLEX 10 FLL-RA (TUBING) ×2 IMPLANT

## 2018-02-02 NOTE — Progress Notes (Signed)
TR BAND REMOVAL  LOCATION:    Radial rt radial   DEFLATED PER PROTOCOL:   yes  TIME BAND OFF / DRESSING APPLIED:    1420/gauze and tegaderm  SITE UPON ARRIVAL:    Level 0  SITE AFTER BAND REMOVAL:    Level 0  CIRCULATION SENSATION AND MOVEMENT:    Within Normal Limits :  Yes; rt hand and fingers warm and pink; sensation present  COMMENTS:

## 2018-02-02 NOTE — H&P (Signed)
History & Physical    Patient ID: Cameron Greene MRN: [id number], DOB/AGE: 1986-10-17   Admit date: 02/02/2018   Primary Physician: Patient, No Pcp Per Primary Cardiologist: Dr. [place]   Patient Profile    32 yo male with PMH of uncontrolled HTN and polysubstance abuse who presented with chest pain and called a STEMI in the field.    Past Medical History   Past Medical History:  Diagnosis Date  . Asthma     Past Surgical History:  Procedure Laterality Date  . KNEE SURGERY    . KNEE SURGERY       Allergies  Allergies  Allergen Reactions  . Bee Venom Anaphylaxis and Hives  . Fish Allergy Anaphylaxis and Hives    Throat swelling, can't breathe.  . Apple Itching  . Banana Itching    History of Present Illness    Cameron Greene is a 32 yo male of uncontrolled HTN and polysubstance. Reports he was on blood pressure medications while he was in jail back in 2012, but has not been on anything since that time. Currently lives at home with his wife. Has been in his usual state of health until this morning around 7am when he was awoken with left sided chest pain. States he wife attempted to give him some tylenol but he vomited this up. Eventually called EMS. Code STEMI called in the field. EKG noted SR with ST elevation anteriorly vs early repolarization. He was brought directly to the cath lab for emergent cardiac cath. Of note does have a hx of cocaine use, and admits to using about a week ago.   Home Medications    Prior to Admission medications   Medication Sig Start Date End Date Taking? Authorizing Provider  albuterol (PROVENTIL HFA;VENTOLIN HFA) 108 (90 Base) MCG/ACT inhaler Inhale 1-2 puffs into the lungs every 6 (six) hours as needed for wheezing or shortness of breath.    [provider]  cyclobenzaprine (FLEXERIL) 10 MG tablet Take 1 tablet (10 mg total) by mouth 2 (two) times daily as needed for muscle spasms. 06/21/17   Janne Napoleon, NP  diclofenac  (VOLTAREN) 50 MG EC tablet Take 1 tablet (50 mg total) by mouth 2 (two) times daily. 06/21/17   Janne Napoleon, NP  HYDROcodone-acetaminophen (NORCO) 5-325 MG per tablet Take 1-2 tablets by mouth every 4 (four) hours as needed for pain. Patient not taking: Reported on 12/08/2016 07/01/13   Blane Ohara, MD  predniSONE (DELTASONE) 20 MG tablet Take 2 tablets (40 mg total) by mouth daily. 12/08/16   Gwyneth Sprout, MD    Family History    Family History  Problem Relation Age of Onset  . Hypertension Mother   . Asthma Mother     Social History    Social History   Socioeconomic History  . Marital status: Single    Spouse name: Not on file  . Number of children: Not on file  . Years of education: Not on file  . Highest education level: Not on file  Occupational History  . Not on file  Social Needs  . Financial resource strain: Not on file  . Food insecurity:    Worry: Not on file    Inability: Not on file  . Transportation needs:    Medical: Not on file    Non-medical: Not on file  Tobacco Use  . Smoking status: Current Every Day Smoker  . Smokeless tobacco: Current User  Substance and Sexual Activity  .  Alcohol use: Yes    Comment: occasonal  . Drug use: Yes    Types: Marijuana  . Sexual activity: Not on file  Lifestyle  . Physical activity:    Days per week: Not on file    Minutes per session: Not on file  . Stress: Not on file  Relationships  . Social connections:    Talks on phone: Not on file    Gets together: Not on file    Attends religious service: Not on file    Active member of club or organization: Not on file    Attends meetings of clubs or organizations: Not on file    Relationship status: Not on file  . Intimate partner violence:    Fear of current or ex partner: Not on file    Emotionally abused: Not on file    Physically abused: Not on file    Forced sexual activity: Not on file  Other Topics Concern  . Not on file  Social History Narrative    . Not on file     Review of Systems    See HPI  All other systems reviewed and are otherwise negative except as noted above.  Physical Exam    Blood pressure (!) 214/109, pulse 63, resp. rate 20, SpO2 96 %.  General: Pleasant, obese young AAM, NAD Psych: Normal affect. Neuro: Alert and oriented X 3. Moves all extremities spontaneously. HEENT: Normal  Neck: Supple without bruits or JVD. Lungs:  Resp regular and unlabored, CTA. Heart: RRR no s3, s4, soft systolic murmurs. Abdomen: Soft, non-tender, non-distended, BS + x 4.  Extremities: No clubbing, cyanosis or edema. DP/PT/Radials 2+ and equal bilaterally.  Labs    Troponin (Point of Care Test) No results for input(s): TROPIPOC in the last 72 hours. Recent Labs    02/02/18 0900  TROPONINI <0.03   Lab Results  Component Value Date   WBC 8.5 02/02/2018   HGB 13.4 02/02/2018   HCT 38.4 (L) 02/02/2018   MCV 87.1 02/02/2018   PLT 182 02/02/2018    Recent Labs  Lab 02/02/18 0900  NA 138  K 3.7  CL 106  CO2 22  BUN 16  CREATININE 1.22  CALCIUM 9.1  PROT 6.9  BILITOT 0.6  ALKPHOS 105  ALT 43  AST 27  GLUCOSE 109*   Lab Results  Component Value Date   CHOL 123 02/02/2018   HDL 33 (L) 02/02/2018   LDLCALC 73 02/02/2018   TRIG 83 02/02/2018   No results found for: [facility]   Radiology Studies    No results found.  ECG & Cardiac Imaging    EKG: SR with ST elevation in the anterior leads  Assessment & Plan    32 yo male with PMH of uncontrolled HTN and polysubstance abuse who presented with chest pain and called a STEMI in the field.    1. STEMI: Woke up with chest pain around 7am this morning with shortness of breath. EKG was abnormal with concern for anterior STEMI vs early repolarization. Given he was actively having chest pain, he was brought to the lab for cardiac cath. Cath with normal coronaries, and normal LVEDP.  -- check lipids and Hgb A1c, cycle troponins  -- check echo   2. HTN: reports  being on blood pressure medications in the past while he was in jail, but has not been on anything since 2012. Systolic noted >180 on admission. Does report recently using cocaine about a week ago. Will  avoid BB at this time.  -- add amlodipine 10mg  and PRN hydralazine  3. Polysubstance abuse: currently smokes marijuana, and tobacco, as well as drinks. Also intermittently uses cocaine with last use about a week ago.  -- check UDS   Severity of Illness: The appropriate patient status for this patient is OBSERVATION. Observation status is judged to be reasonable and necessary in order to provide the required intensity of service to ensure the patient's safety. The patient's presenting symptoms, physical exam findings, and initial radiographic and laboratory data in the context of their medical condition is felt to place them at decreased risk for further clinical deterioration. Furthermore, it is anticipated that the patient will be medically stable for discharge from the hospital within 2 midnights of admission. The following factors support the patient status of observation.   " The patient's presenting symptoms include chest pain. " The physical exam findings include obese. " The initial radiographic and laboratory data are abnormal EKG concerning for STEMI.      Janice CoffinSigned, Lindsay Roberts, NP-C Pager [phone] 02/02/2018, 10:33 AM

## 2018-02-02 NOTE — Progress Notes (Signed)
Dr. SwazilandJordan notified of high BP.

## 2018-02-03 ENCOUNTER — Observation Stay (HOSPITAL_BASED_OUTPATIENT_CLINIC_OR_DEPARTMENT_OTHER): Payer: Self-pay

## 2018-02-03 ENCOUNTER — Encounter (HOSPITAL_COMMUNITY): Payer: Self-pay | Admitting: Cardiology

## 2018-02-03 DIAGNOSIS — I421 Obstructive hypertrophic cardiomyopathy: Secondary | ICD-10-CM

## 2018-02-03 LAB — RAPID URINE DRUG SCREEN, HOSP PERFORMED
Amphetamines: NOT DETECTED
BARBITURATES: NOT DETECTED
Benzodiazepines: NOT DETECTED
Cocaine: POSITIVE — AB
Opiates: NOT DETECTED
Tetrahydrocannabinol: POSITIVE — AB

## 2018-02-03 LAB — ECHOCARDIOGRAM COMPLETE: Weight: 4832.48 oz

## 2018-02-03 LAB — TROPONIN I

## 2018-02-03 MED ORDER — AMLODIPINE BESYLATE 10 MG PO TABS: 10.0000 mg | ORAL_TABLET | Freq: Every day | ORAL | 0 refills | Status: DC

## 2018-02-03 MED ORDER — AMLODIPINE BESYLATE 10 MG PO TABS: 10.0000 mg | ORAL_TABLET | Freq: Every day | ORAL | 2 refills | Status: DC

## 2018-02-03 MED FILL — Heparin Sodium (Porcine) 2 Unit/ML in Sodium Chloride 0.9%: INTRAMUSCULAR | Qty: 1000 | Status: AC

## 2018-02-03 MED FILL — Nitroglycerin IV Soln 100 MCG/ML in D5W: INTRA_ARTERIAL | Qty: 10 | Status: AC

## 2018-02-03 MED FILL — AMLODIPINE BESYLATE 10 MG T: 10 | 30 days supply | Qty: 30 | Fill #0

## 2018-02-03 NOTE — Discharge Summary (Addendum)
The patient has been seen in conjunction with Laverda Page, [facility]. All aspects of care have been considered and discussed. The patient has been personally interviewed, examined, and all clinical data has been reviewed.    Presented with possible ST elevation myocardial infarction.  No EKG during chest pain is available on the chart for review.  STEMI protocol was activated and angiography demonstrated no significant obstruction.  LV pressures were normal and no regional wall motion abnormality identified.  Notable was extreme blood pressure elevation.  ECG this morning demonstrates incomplete right bundle with prominent voltage suggesting LVH.  Mild precordial ST elevation likely represents early repolarization.  Cannot totally exclude the possibility of pericarditis.  Will continue anti-hypertensive therapy with amlodipine.  Will need general medical follow-up.  An echocardiogram will be helpful to assess for LVH.  Discharge today after amlodipine given and blood pressure assessed greater than 2 hours later.   Discharge Summary    Patient ID: Cameron Greene,  MRN: [id number], DOB/AGE: 01-21-1986 32 y.o.  Admit date: 02/02/2018 Discharge date: 02/03/2018  Primary Care Provider: Patient, No Pcp Per Primary Cardiologist: New (Dr. Swaziland)  Discharge Diagnoses    Principal Problem:   Precordial chest pain Active Problems:   HTN (hypertension)   Allergies Allergies  Allergen Reactions  . Bee Venom Anaphylaxis and Hives  . Fish Allergy Anaphylaxis and Hives    Throat swelling, can't breathe.  . Apple Itching  . Banana Itching    Diagnostic Studies/Procedures    Cath: 02/02/18  Conclusion     The left ventricular systolic function is normal.  LV end diastolic pressure is normal.  The left ventricular ejection fraction is 55-65% by visual estimate.   1. Normal coronary anatomy 2. Normal LV function 3. Normal LVEDP  Plan: observation today. Check lab  work. Based on these findings I think his Ecg changes are related to early repolarization.   _____________   History of Present Illness     32 yo male of uncontrolled HTN and polysubstance. Reports he was on blood pressure medications while he was in jail back in 2012, but has not been on anything since that time. Currently lives at home with his wife. Has been in his usual state of health until this morning around 7am when he was awoken with left sided chest pain. States he wife attempted to give him some tylenol but he vomited this up. Eventually called EMS. Code STEMI called in the field. EKG noted SR with ST elevation anteriorly vs early repolarization. He was brought directly to the cath lab for emergent cardiac cath. Of note does have a hx of cocaine use, and admits to using about a week ago.    Hospital Course    He was brought to the cath lab emergently by Dr. Swaziland noting normal coronaries with normal LVEDP and LV function. He was noted to be very hypertensive on arrival with systolic >180. Given his recent cocaine use he was started on amlodipine 10mg  with significant improvement in blood pressure. No further chest pain overnight post cath. Hgb A1c 4.1, LDL 73.   General: Well developed, well nourished, male appearing in no acute distress. Head: Normocephalic, atraumatic.  Neck: Supple without bruits, JVD. Lungs:  Resp regular and unlabored, CTA. Heart: RRR, S1, S2, no S3, S4, or murmur; no rub. Abdomen: Soft, non-tender, non-distended with normoactive bowel sounds. No hepatomegaly. No rebound/guarding. No obvious abdominal masses. Extremities: No clubbing, cyanosis, edema. Distal pedal pulses are 2+ bilaterally.  R radial cath site stable without bruising or hematoma Neuro: Alert and oriented X 3. Moves all extremities spontaneously. Psych: Normal affect.  Cameron Greene was seen by Dr. Katrinka BlazingSmith and determined stable for discharge home. Follow up in the office has been arranged.  Medications are listed below.   _____________  Discharge Vitals Blood pressure (!) 143/65, pulse 73, temperature 98.4 F (36.9 C), temperature source Oral, resp. rate 20, weight (!) 302 lb 0.5 oz (137 kg), SpO2 97 %.  Filed Weights   02/03/18 0616  Weight: (!) 302 lb 0.5 oz (137 kg)    Labs & Radiologic Studies    CBC Recent Labs    02/02/18 0840 02/02/18 0900  WBC  --  8.5  HGB 12.9* 13.4  HCT 38.0* 38.4*  MCV  --  87.1  PLT  --  182   Basic Metabolic Panel Recent Labs    04/54/0903/27/19 0840 02/02/18 0900  NA 142 138  K 3.7 3.7  CL 104 106  CO2  --  22  GLUCOSE 111* 109*  BUN 19 16  CREATININE 1.20 1.22  CALCIUM  --  9.1   Liver Function Tests Recent Labs    02/02/18 0900  AST 27  ALT 43  ALKPHOS 105  BILITOT 0.6  PROT 6.9  ALBUMIN 3.9   No results for input(s): LIPASE, AMYLASE in the last 72 hours. Cardiac Enzymes Recent Labs    02/02/18 0900 02/03/18 0821  TROPONINI <0.03 <0.03   BNP Invalid input(s): POCBNP D-Dimer No results for input(s): DDIMER in the last 72 hours. Hemoglobin A1C Recent Labs    02/02/18 0900  HGBA1C 4.1*   Fasting Lipid Panel Recent Labs    02/02/18 0900  CHOL 123  HDL 33*  LDLCALC 73  TRIG 83  CHOLHDL 3.7   Thyroid Function Tests No results for input(s): TSH, T4TOTAL, T3FREE, THYROIDAB in the last 72 hours.  Invalid input(s): FREET3 _____________  No results found. Disposition   Pt is being discharged home today in good condition.  Follow-up Plans & Appointments    Follow-up Information    [facility] Follow up on 02/21/2018.   Why:  for Hospital Follow Up  Appointment @ 8:30 am with Loreen FreudZelda Flemming. If you can not make this scheduled appointment please contact the office to reschedule.  Contact information: [place] [place] [phone]       [facility]. Go to.   Why:  This locations  Pharmacy for medications that range in cost from $4.00-$10.00.  Contact information: [address] [place] [phone]       SwazilandJordan, Peter M, MD Follow up.   Specialty:  Cardiology Why:  The office will call you with a follow up appt.  Contact information: [address] [place] [address] [phone]          Discharge Instructions    Call MD for:  redness, tenderness, or signs of infection (pain, swelling, redness, odor or green/yellow discharge around incision site)   Complete by:  As directed    Diet - low sodium heart healthy   Complete by:  As directed    Discharge instructions   Complete by:  As directed    Radial Site Care Refer to this sheet in the next few weeks. These instructions provide you with information on caring for yourself after your procedure. Your caregiver may also give you  more specific instructions. Your treatment has been planned according to current medical practices, but problems sometimes occur. Call your caregiver if you have any problems or questions after your procedure. HOME CARE INSTRUCTIONS You may shower the day after the procedure.Remove the bandage (dressing) and gently wash the site with plain soap and water.Gently pat the site dry.  Do not apply powder or lotion to the site.  Do not submerge the affected site in water for 3 to 5 days.  Inspect the site at least twice daily.  Do not flex or bend the affected arm for 24 hours.  No lifting over 5 pounds (2.3 kg) for 5 days after your procedure.  Do not drive home if you are discharged the same day of the procedure. Have someone else drive you.  You may drive 24 hours after the procedure unless otherwise instructed by your caregiver.  What to expect: Any bruising will usually fade within 1 to 2 weeks.  Blood that collects in the tissue (hematoma) may be painful to the touch. It should usually decrease in size and tenderness within 1 to 2  weeks.  SEEK IMMEDIATE MEDICAL CARE IF: You have unusual pain at the radial site.  You have redness, warmth, swelling, or pain at the radial site.  You have drainage (other than a small amount of blood on the dressing).  You have chills.  You have a fever or persistent symptoms for more than 72 hours.  You have a fever and your symptoms suddenly get worse.  Your arm becomes pale, cool, tingly, or numb.  You have heavy bleeding from the site. Hold pressure on the site.   Increase activity slowly   Complete by:  As directed       Discharge Medications     Medication List    STOP taking these medications   albuterol 108 (90 Base) MCG/ACT inhaler Commonly known as:  PROVENTIL HFA;VENTOLIN HFA   cyclobenzaprine 10 MG tablet Commonly known as:  FLEXERIL   diclofenac 50 MG EC tablet Commonly known as:  VOLTAREN   HYDROcodone-acetaminophen 5-325 MG tablet Commonly known as:  NORCO   predniSONE 20 MG tablet Commonly known as:  DELTASONE     TAKE these medications   amLODipine 10 MG tablet Commonly known as:  NORVASC Take 1 tablet (10 mg total) by mouth daily.         Outstanding Labs/Studies   N/a   Duration of Discharge Encounter   Greater than 30 minutes including physician time.  Signed, Laverda Page NP-C 02/03/2018, 11:18 AM

## 2018-02-03 NOTE — Progress Notes (Signed)
  Echocardiogram 2D Echocardiogram has been performed.  Leta JunglingCooper, Jahzion Brogden M 02/03/2018, 11:22 AM

## 2018-02-03 NOTE — Care Management Note (Addendum)
Case Management Note  Patient Details  Name: Cameron Greene MRN: [id number] Date of Birth: 11/16/1985  Subjective/Objective: Pt presented for Chest Pain and uncontrolled hypertension. Pt is without Insurance and PCP.                    Action/Plan: CM did speak with patient to see if it was ok to establish [facility]CP with [facility]. CM did call the Clinic and is awaiting return phone call. Pt will be able to [facility] the [facility] with medication ranging from $4.00-$10.00. Please [facility] @ d/c. No further needs identified from CM at this time.   Expected Discharge Date:                  Expected Discharge Plan:  Home/Self Care  In-House Referral:  NA  Discharge planning Services  CM Consult, Indigent [facility] Clinic, Follow-up appt scheduled, Medication Assistance  Post Acute Care Choice:  NA Choice offered to:  NA  DME Arranged:  N/A DME Agency:  NA  HH Arranged:  NA HH Agency:  NA  Status of Service:  Completed, signed off  If discussed at Long Length of Stay Meetings, dates discussed:    Additional Comments: 1004 02-03-18 Tomi BambergerBrenda Graves-Bigelow, RN,BSN [phone] Appointment Established at the [facility].  Gala LewandowskyGraves-Bigelow, Arnulfo Batson Kaye, RN 02/03/2018, 9:53 AM

## 2018-02-03 NOTE — Progress Notes (Addendum)
The patient has been seen in conjunction with Laverda PageLindsay Roberts, [facility]. All aspects of care have been considered and discussed. The patient has been personally interviewed, examined, and all clinical data has been reviewed.   Presented with possible ST elevation myocardial infarction.  No EKG during chest pain is available on the chart for review.  STEMI protocol was activated and angiography demonstrated no significant obstruction.  LV pressures were normal and no regional wall motion abnormality identified.  Notable was extreme blood pressure elevation.  ECG this morning demonstrates incomplete right bundle with prominent voltage suggesting LVH.  Mild precordial ST elevation likely represents early repolarization.  Cannot totally exclude the possibility of pericarditis.  Will continue anti-hypertensive therapy with amlodipine.  Will need general medical follow-up.  An echocardiogram will be helpful to assess for LVH.  Discharge today after amlodipine given and blood pressure assessed greater than 2 hours later.

## 2018-02-21 ENCOUNTER — Inpatient Hospital Stay (INDEPENDENT_AMBULATORY_CARE_PROVIDER_SITE_OTHER): Payer: Self-pay | Admitting: Nurse Practitioner

## 2018-02-22 NOTE — Progress Notes (Deleted)
Cardiology Office Note   Date:  02/22/2018   ID:  Cameron Greene, DOB 05-17-1986, MRN [id number]  PCP:  Patient, No Pcp Per  Cardiologist:  Dr.Jordan  No chief complaint on file.    History of Present Illness: Cameron Greene is a 32 y.o. male who presents for post hospital follow up after admission for precordial chest pain and hypertension.He has a history of polysubstance abuse (cocaine, tobacco,  Marijuana) He ultimately had cardiac cath on 02/02/2018 due to ST elevation in the anterior leads vs early repolarization, revealing normal coronary anatomy. He was started on amlodipine 10 mg daily for BP control.    Past Medical History:  Diagnosis Date  . Arthritis    "right knee" (02/02/2018)  . Asthma   . Gout   . Hypertension     Past Surgical History:  Procedure Laterality Date  . CARDIAC CATHETERIZATION  02/02/2018  . KNEE ARTHROSCOPY Right ~ 2009   "shaved behind my kneecap"  . LEFT HEART CATH AND CORONARY ANGIOGRAPHY N/A 02/02/2018   Procedure: LEFT HEART CATH AND CORONARY ANGIOGRAPHY;  Surgeon: SwazilandJordan, Cameron M, MD;  [facility];  Service: Cardiovascular;  Laterality: N/A;     Current Outpatient Medications  Medication Sig Dispense Refill  . amLODipine (NORVASC) 10 MG tablet Take 1 tablet (10 mg total) by mouth daily. 30 tablet 0   No current facility-administered medications for this visit.     Allergies:   Bee venom; Fish allergy; Apple; and Banana    Social History:  The patient  reports that he has been smoking cigarettes.  He has a 2.30 pack-year smoking history. He has never used smokeless tobacco. He reports that he drinks alcohol. He reports that he has current or past drug history. Drug: Marijuana.   Family History:  The patient's family history includes Asthma in his mother; Hypertension in his mother.    ROS: All other systems are reviewed and negative. Unless otherwise mentioned in H&P    PHYSICAL EXAM: VS:  There were no vitals  taken for this visit. , BMI There is no height or weight on file to calculate BMI. GEN: Well nourished, well developed, in no acute distress  HEENT: normal  Neck: no JVD, carotid bruits, or masses Cardiac: ***RRR; no murmurs, rubs, or gallops,no edema  Respiratory:  clear to auscultation bilaterally, normal work of breathing GI: soft, nontender, nondistended, + BS MS: no deformity or atrophy  Skin: warm and dry, no rash Neuro:  Strength and sensation are intact Psych: euthymic mood, full affect   EKG:  EKG {ACTION; IS/IS [id number]} ordered today. The ekg ordered today demonstrates ***   Recent Labs: 02/02/2018: ALT 43; BUN 16; Creatinine, Ser 1.22; Hemoglobin 13.4; Platelets 182; Potassium 3.7; Sodium 138    Lipid Panel    Component Value Date/Time   CHOL 123 02/02/2018 0900   TRIG 83 02/02/2018 0900   HDL 33 (L) 02/02/2018 0900   CHOLHDL 3.7 02/02/2018 0900   VLDL 17 02/02/2018 0900   LDLCALC 73 02/02/2018 0900      Wt Readings from Last 3 Encounters:  02/03/18 (!) 302 lb 0.5 oz (137 kg)  12/08/16 300 lb (136.1 kg)  11/06/11 200 lb (90.7 kg)      Other studies Reviewed: Additional studies/ records that were reviewed today include: ***. Review of the above records demonstrates: ***   ASSESSMENT AND PLAN:  1.  ***   Current medicines are reviewed at length with the patient today.  Labs/ tests ordered today include: *** Bettey Mare. Liborio Nixon, ANP, AACC   02/22/2018 4:42 PM    [facility] [address] [place], [place] [address] Phone: [phone]; Fax: [phone]

## 2018-02-23 ENCOUNTER — Ambulatory Visit: Payer: Self-pay | Admitting: Adult Health

## 2018-03-02 ENCOUNTER — Ambulatory Visit (INDEPENDENT_AMBULATORY_CARE_PROVIDER_SITE_OTHER): Payer: Self-pay | Admitting: Adult Health

## 2018-03-02 ENCOUNTER — Encounter: Payer: Self-pay | Admitting: Adult Health

## 2018-03-02 VITALS — BP 140/86 | HR 70 | Ht 72.0 in | Wt 321.2 lb

## 2018-03-02 DIAGNOSIS — R0602 Shortness of breath: Secondary | ICD-10-CM

## 2018-03-02 DIAGNOSIS — R0681 Apnea, not elsewhere classified: Secondary | ICD-10-CM

## 2018-03-02 DIAGNOSIS — I10 Essential (primary) hypertension: Secondary | ICD-10-CM

## 2018-03-02 MED ORDER — AMLODIPINE BESYLATE 10 MG PO TABS: 10.0000 mg | ORAL_TABLET | Freq: Every day | ORAL | 3 refills | Status: DC

## 2018-03-02 NOTE — Progress Notes (Signed)
Cardiology Office Note   Date:  03/02/2018   ID:  JAKEOB TULLIS, DOB [birth year], MRN [id number]  PCP:  Patient, No Pcp Per  Cardiologist:  Dr. Swaziland   Chief Complaint  Patient presents with  . Follow-up     History of Present Illness: Cameron Greene is a 32 y.o. male who presents for post hospitalization follow up after admission for precordial chest pain. Known history of hypertension.  Since discharge he has been doing well. He still has no energy. He does not sleep well and falls asleep easily during the day., He is medically compliant.   Past Medical History:  Diagnosis Date  . Arthritis    "right knee" (02/02/2018)  . Asthma   . Gout   . Hypertension     Past Surgical History:  Procedure Laterality Date  . CARDIAC CATHETERIZATION  02/02/2018  . KNEE ARTHROSCOPY Right ~ 2009   "shaved behind my kneecap"  . LEFT HEART CATH AND CORONARY ANGIOGRAPHY N/A 02/02/2018   Procedure: LEFT HEART CATH AND CORONARY ANGIOGRAPHY;  Surgeon: Swaziland, Peter M, MD;  [facility]: [facility]AS[facility] CV LAB;  Service: Cardiovascular;  Laterality: N/A;     Current Outpatient Medications  Medication Sig Dispense Refill  . amLODipine (NORVASC) 10 MG tablet Take 1 tablet (10 mg total) by mouth daily. 30 tablet 0   No current facility-administered medications for this visit.     Allergies:   Bee venom; Fish allergy; Apple; and Banana    Social History:  The patient  reports that he has been smoking cigarettes.  He has a 2.30 pack-year smoking history. He has never used smokeless tobacco. He reports that he drinks alcohol. He reports that he has current or past drug history. Drug: Marijuana.   Family History:  The patient's family history includes Asthma in his mother; Hypertension in his mother.    ROS: All other systems are reviewed and negative. Unless otherwise mentioned in H&P    PHYSICAL EXAM: VS:  BP 140/86   Pulse 70   Ht 6' (1.829 m)   Wt (!) 321 lb 3.2 oz (145.7 kg)   BMI 43.56  kg/m  , BMI Body mass index is 43.56 kg/m. GEN: Well nourished, well developed, in no acute distress  HEENT: normal  Neck: no JVD, carotid bruits, or masses Cardiac: RRR; no murmurs, rubs, or gallops,no edema  Respiratory:  clear to auscultation bilaterally, normal work of breathing GI: soft, nontender, nondistended, + BS MS: no deformity or atrophy  Skin: warm and dry, no rash. Multiple tattoos  Neuro:  Strength and sensation are intact Psych: euthymic mood, full affect   EKG:  NSR with incomplete RBBB. Rate of 70 bpm. Changes related to early repolarization.   Recent Labs: 02/02/2018: ALT 43; BUN 16; Creatinine, Ser 1.22; Hemoglobin 13.4; Platelets 182; Potassium 3.7; Sodium 138    Lipid Panel    Component Value Date/Time   CHOL 123 02/02/2018 0900   TRIG 83 02/02/2018 0900   HDL 33 (L) 02/02/2018 0900   CHOLHDL 3.7 02/02/2018 0900   VLDL 17 02/02/2018 0900   LDLCALC 73 02/02/2018 0900      Wt Readings from Last 3 Encounters:  03/02/18 (!) 321 lb 3.2 oz (145.7 kg)  02/03/18 (!) 302 lb 0.5 oz (137 kg)  12/08/16 300 lb (136.1 kg)      Other studies Reviewed: Cardiac Cath Conclusion     The left ventricular systolic function is normal.  LV end diastolic pressure is normal.  The left ventricular ejection fraction is 55-65% by visual estimate.   1. Normal coronary anatomy 2. Normal LV function 3. Normal LVEDP      ASSESSMENT AND PLAN:  1. Hypertension: Better controlled on amlodipine 10 mg daily. Continue this. Low sodium diet is recommended.   2. Possible OSA: Symptoms are suspected, snoring, hypertension, daytime sleepiness and fatigue. He has large body habitus as well. I have discussed and recommended sleep study. He is willing to proceed with this.    Current medicines are reviewed at length with the patient today.    Labs/ tests ordered today include: Sleep Study.   Bettey MareKathryn M. Liborio NixonLawrence DNP, ANP, AACC   03/02/2018 4:13 PM    [facility] [address], [place], [place] [address] Phone: [phone]; Fax: [phone]

## 2018-03-02 NOTE — Patient Instructions (Signed)
Medication Instructions:  NO CHANGES- Your physician recommends that you continue on your current medications as directed. Please refer to the Current Medication list given to you today.  If you need a refill on your cardiac medications before your next appointment, please call your pharmacy.  Testing/Procedures: Your physician has recommended that you have a sleep study. This test records several body functions during sleep, including: brain activity, eye movement, oxygen and carbon dioxide blood levels, heart rate and rhythm, breathing rate and rhythm, the flow of air through your mouth and nose, snoring, body muscle movements, and chest and belly movement.  Follow-Up: Your physician wants you to follow-up in: AFTER SLEEP STUDY WITH DR JORDAN  [facility] you [facility]!!

## 2018-03-03 ENCOUNTER — Ambulatory Visit (HOSPITAL_BASED_OUTPATIENT_CLINIC_OR_DEPARTMENT_OTHER): Payer: Self-pay | Attending: Adult Health | Admitting: Cardiovascular Disease

## 2018-03-03 VITALS — Ht 72.0 in | Wt 321.0 lb

## 2018-03-03 DIAGNOSIS — R0902 Hypoxemia: Secondary | ICD-10-CM | POA: Insufficient documentation

## 2018-03-03 DIAGNOSIS — G4730 Sleep apnea, unspecified: Secondary | ICD-10-CM

## 2018-03-03 DIAGNOSIS — G4733 Obstructive sleep apnea (adult) (pediatric): Secondary | ICD-10-CM

## 2018-03-03 DIAGNOSIS — I10 Essential (primary) hypertension: Secondary | ICD-10-CM

## 2018-03-03 DIAGNOSIS — R0602 Shortness of breath: Secondary | ICD-10-CM

## 2018-03-03 DIAGNOSIS — R0681 Apnea, not elsewhere classified: Secondary | ICD-10-CM

## 2018-03-04 ENCOUNTER — Encounter: Payer: Self-pay | Admitting: Cardiology

## 2018-03-20 NOTE — Progress Notes (Deleted)
Cardiology Office Note   Date:  03/20/2018   ID:  Cameron Greene, DOB 07/20/1986, MRN [id number]  PCP:  Patient, No Pcp Per  Cardiologist:   Harland Aguiniga [place], MD   No chief complaint on file.     History of Present Illness: Cameron Greene is a 32 y.o. male who presents for post hospital follow up of chest pain, HTN. He was admitted in March with chest pain and Ecg changes. Cardiac cath was normal. He was severely hypertensive and Ecg changes were felt to be related to LVH and repolarization abnormality. Echo showed severe LVH with repolarization abnormality. Sleep study did not appear to show significant apnea. Full report pending.    Past Medical History:  Diagnosis Date  . Arthritis    "right knee" (02/02/2018)  . Asthma   . Gout   . Hypertension     Past Surgical History:  Procedure Laterality Date  . CARDIAC CATHETERIZATION  02/02/2018  . KNEE ARTHROSCOPY Right ~ 2009   "shaved behind my kneecap"  . LEFT HEART CATH AND CORONARY ANGIOGRAPHY N/A 02/02/2018   Procedure: LEFT HEART CATH AND CORONARY ANGIOGRAPHY;  Surgeon: [place], Sayuri Rhames M, MD;  Location: [facility] INVASIVE CV LAB;  Service: Cardiovascular;  Laterality: N/A;     Current Outpatient Medications  Medication Sig Dispense Refill  . amLODipine (NORVASC) 10 MG tablet Take 1 tablet (10 mg total) by mouth daily. 90 tablet 3   No current facility-administered medications for this visit.     Allergies:   Bee venom; Fish allergy; Apple; and Banana    Social History:  The patient  reports that he has been smoking cigarettes.  He has a 2.30 pack-year smoking history. He has never used smokeless tobacco. He reports that he drinks alcohol. He reports that he has current or past drug history. Drug: Marijuana.   Family History:  The patient's family history includes Asthma in his mother; Hypertension in his mother.    ROS:  Please see the history of present illness.   Otherwise, review of systems are positive for none.    All other systems are reviewed and negative.    PHYSICAL EXAM: VS:  There were no vitals taken for this visit. , BMI There is no height or weight on file to calculate BMI. GEN: Well nourished, well developed, in no acute distress  HEENT: normal  Neck: no JVD, carotid bruits, or masses Cardiac: RRR; no murmurs, rubs, or gallops,no edema  Respiratory:  clear to auscultation bilaterally, normal work of breathing GI: soft, nontender, nondistended, + BS MS: no deformity or atrophy  Skin: warm and dry, no rash Neuro:  Strength and sensation are intact Psych: euthymic mood, full affect   EKG:  EKG {ACTION; IS/IS NWG:[id number]} ordered today. The ekg ordered today demonstrates ***   Recent Labs: 02/02/2018: ALT 43; BUN 16; Creatinine, Ser 1.22; Hemoglobin 13.4; Platelets 182; Potassium 3.7; Sodium 138    Lipid Panel    Component Value Date/Time   CHOL 123 02/02/2018 0900   TRIG 83 02/02/2018 0900   HDL 33 (L) 02/02/2018 0900   CHOLHDL 3.7 02/02/2018 0900   VLDL 17 02/02/2018 0900   LDLCALC 73 02/02/2018 0900      Wt Readings from Last 3 Encounters:  03/03/18 (!) 321 lb (145.6 kg)  03/02/18 (!) 321 lb 3.2 oz (145.7 kg)  02/03/18 (!) 302 lb 0.5 oz (137 kg)      Other studies Reviewed: Additional studies/ records that were reviewed today  include:   Cath: 02/02/18  Conclusion     The left ventricular systolic function is normal.  LV end diastolic pressure is normal.  The left ventricular ejection fraction is 55-65% by visual estimate.  1. Normal coronary anatomy 2. Normal LV function 3. Normal LVEDP  Plan: observation today. Check lab work. Based on these findings I think his Ecg changes are related to early repolarization.     Echo 02/03/18: Meriel Flavors  ECHO COMPLETE WO IMAGING ENHANCING AGENT  Order# [id number]  Reading physician: Chrystie Nose, MD Ordering physician: Lyn Records, MD Study date: 02/03/18  Study Result   Result status: Edited  Result - FINAL                              *[place]*                   *[facility]*                         [address]                        [place], [place] [address]                            [phone]  ------------------------------------------------------------------- Transthoracic Echocardiography  (Report amended )  Patient:    Cameron Greene, Cameron Greene MR #:       [id number] Study Date: 02/03/2018 Gender:     M Age:        24 Height:     182.9 cm Weight:     137 kg BSA:        2.7 m^2 Pt. Status: Room:       6C08C   ORDERING     Lyn Records, MD  REFERRING    Lyn Records, MD  ADMITTING    Asli Tokarski [place], M.D.  ATTENDING    Iqra Rotundo [place], M.D.  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  ------------------------------------------------------------------- History:   PMH:   Coronary artery disease.  PMH:   Myocardial infarction.  ------------------------------------------------------------------- Study Conclusions  - Left ventricle: The cavity size was normal. 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 Systolic function was normal. The estimated   ejection fraction was in the range of 55% to 60%. globally   abnormal longitudinal strain at -11%. Doppler parameters are   consistent with abnormal left ventricular relaxation (grade 1   diastolic dysfunction). The E/e&' ratio is <8, suggesting normal   LV filling pressure. - Mitral valve: Mildly thickened leaflets . There was trivial   regurgitation. - Left atrium: The atrium was normal in size. - Inferior vena cava:  The vessel was normal in size. The   respirophasic diameter changes were in the normal range (= 50%),   consistent with normal central venous pressure.  Impressions:  - LVEF 55-60%, severe LV wall thicknening up to 1.8 cm (concerning   for global variant HCM), No significant LVOT obstruction is noted   at rest, abnormal GLS  at -11%, grade 1 DD, normal LV filling   pressure, trivial MR, normal LA size, normal IVC.  ------------------------------------------------------------------- Study data:  No prior study was available for comparison.  Study status:  Routine.  Procedure:  The patient reported no pain pre or post test. Transthoracic echocardiography. Image quality was adequate.  Study completion:  There were no complications. Transthoracic echocardiography.  M-mode, complete 2D, spectral Doppler, and color Doppler.  Birthdate:  Patient birthdate: 09/20/1986.  Age:  Patient is 32 yr old.  Sex:  Gender: male. BMI: 41 kg/m^2.  Blood pressure:     143/65  Patient status: Inpatient.  Study date:  Study date: 02/03/2018. Study time: 10:13 AM.  Location:  Bedside.  -------------------------------------------------------------------  ------------------------------------------------------------------- Left ventricle:  The cavity size was normal. There was severe concentric hypertrophy with LV wall thickness up to 1.8 cm - findings concerning for global variant hypertrophic cardiomyopathy. Systolic function was normal. The estimated ejection fraction was in the range of 55% to 60%. globally abnormal longitudinal strain at -11%. Doppler parameters are consistent with abnormal left ventricular relaxation (grade 1 diastolic dysfunction). The E/e&' ratio is <8, suggesting normal LV filling pressure.  ------------------------------------------------------------------- Aortic valve:   Trileaflet.  Doppler:   There was no stenosis. There was no  regurgitation.  ------------------------------------------------------------------- Aorta:  Aortic root: The aortic root was normal in size. Ascending aorta: The ascending aorta was normal in size.  ------------------------------------------------------------------- Mitral valve:   Mildly thickened leaflets .  Doppler:  There was trivial regurgitation.    Valve area by pressure half-time: 3.55 cm^2. Indexed valve area by pressure half-time: 1.32 cm^2/m^2.  ------------------------------------------------------------------- Left atrium:  The atrium was normal in size.  ------------------------------------------------------------------- Atrial septum:  No defect or patent foramen ovale was identified.   ------------------------------------------------------------------- Right ventricle:  The cavity size was normal. Wall thickness was normal. Systolic function was normal.  ------------------------------------------------------------------- Pulmonic valve:    The valve appears to be grossly normal. Doppler:  There was no significant regurgitation.  ------------------------------------------------------------------- Tricuspid valve:   Doppler:  There was no significant regurgitation.  ------------------------------------------------------------------- Pulmonary artery:   The main pulmonary artery was normal-sized.  ------------------------------------------------------------------- Right atrium:  The atrium was normal in size.  ------------------------------------------------------------------- Pericardium:  There was no pericardial effusion.  ------------------------------------------------------------------- Systemic veins: Inferior vena cava: The vessel was normal in size. The respirophasic diameter changes were in the normal range (= 50%), consistent with normal central venous  pressure.  ------------------------------------------------------------------- Measurements   Left ventricle                           Value          Reference  LV ID, ED, PLAX chordal                  47    mm       43 - 52  LV ID, ES, PLAX chordal                  36    mm       23 - 38  LV fx shortening, PLAX chordal (L)       23    %        >=29  LV PW thickness, ED                      17    mm       ----------  IVS/LV  PW ratio, ED                      1.06           <=1.3  Stroke volume, 2D                        80    ml       ----------  Stroke volume/bsa, 2D                    30    ml/m^2   ----------  LV e&', lateral                           8.38  cm/s     ----------  LV E/e&', lateral                         6.19           ----------  LV e&', medial                            4.46  cm/s     ----------  LV E/e&', medial                          11.64          ----------  LV e&', average                           6.42  cm/s     ----------  LV E/e&', average                         8.08           ----------    Ventricular septum                       Value          Reference  IVS thickness, ED                        18    mm       ----------    LVOT                                     Value          Reference  LVOT ID, S                               25    mm       ----------  LVOT area                                4.91  cm^2     ----------  LVOT peak velocity, S                    86.5  cm/s     ----------  LVOT mean velocity, S  60.4  cm/s     ----------  LVOT VTI, S                              16.3  cm       ----------  LVOT peak gradient, S                    3     mm Hg    ----------    Aorta                                    Value          Reference  Aortic root ID, ED                       34    mm       ----------    Left atrium                              Value          Reference  LA ID, A-P, ES                           39    mm        ----------  LA ID/bsa, A-P                           1.45  cm/m^2   <=2.2  LA volume, S                             80.4  ml       ----------  LA volume/bsa, S                         29.8  ml/m^2   ----------  LA volume, ES, 1-p A4C                   72.8  ml       ----------  LA volume/bsa, ES, 1-p A4C               27    ml/m^2   ----------  LA volume, ES, 1-p A2C                   77    ml       ----------  LA volume/bsa, ES, 1-p A2C               28.6  ml/m^2   ----------    Mitral valve                             Value          Reference  Mitral E-wave peak velocity              51.9  cm/s     ----------  Mitral A-wave peak velocity              52.4  cm/s     ----------  Mitral deceleration time  211   ms       150 - 230  Mitral pressure half-time                62    ms       ----------  Mitral E/A ratio, peak                   1              ----------  Mitral valve area, PHT, DP               3.55  cm^2     ----------  Mitral valve area/bsa, PHT, DP           1.32  cm^2/m^2 ----------    Right atrium                             Value          Reference  RA ID, S-I, ES, A4C            (H)       56.8  mm       34 - 49  RA area, ES, A4C                         16.3  cm^2     8.3 - 19.5  RA volume, ES, A/L                       39.2  ml       ----------  RA volume/bsa, ES, A/L                   14.5  ml/m^2   ----------    Right ventricle                          Value          Reference  TAPSE                                    22.2  mm       ----------  RV s&', lateral, S                        13.5  cm/s     ----------  Legend: (L)  and  (H)  mark values outside specified reference range.  ------------------------------------------------------------------- Leighton Roach MD 2019-03-28T12:33:07  MERGE Images      ASSESSMENT AND PLAN:  1.  ***   Current medicines are reviewed at length with the patient today.  The patient {ACTIONS;  HAS/DOES NOT HAVE:19233} concerns regarding medicines.  The following changes have been made:  {PLAN; NO CHANGE:13088:s}  Labs/ tests ordered today include: *** No orders of the defined types were placed in this encounter.    Disposition:   FU with *** in {gen number 1-61:096045} {Days to years:10300}  Signed, Tanecia Mccay [place], MD  03/20/2018 7:48 AM    [facility] [address], [place], [place], [address] Phone [phone], Fax [phone]

## 2018-03-23 ENCOUNTER — Ambulatory Visit: Payer: Self-pay | Admitting: Cardiology

## 2018-03-24 ENCOUNTER — Encounter: Payer: Self-pay | Admitting: Cardiology

## 2018-03-27 ENCOUNTER — Encounter (HOSPITAL_BASED_OUTPATIENT_CLINIC_OR_DEPARTMENT_OTHER): Payer: Self-pay | Admitting: Cardiovascular Disease

## 2018-03-27 NOTE — Procedures (Signed)
    Patient Name: Yonatan, Guitron Date: 03/03/2018 Gender: Male D.O.B: 1985-11-15 Age (years): 31 Referring Provider: Joni Reining NP Height (inches): 70 Interpreting Physician: Nicki Guadalajara MD, ABSM Weight (lbs): 321 RPSGT: Cherylann Parr BMI: 46 MRN: [id number] Neck Size: 20.00  CLINICAL INFORMATION Sleep Study Type: NPSG  Indication for sleep study: Hypertension, Obesity, Snoring, Witnesses Apnea / Gasping During Sleep  Epworth Sleepiness Score: 6  SLEEP STUDY TECHNIQUE As per the AASM Manual for the Scoring of Sleep and Associated Events v2.3 (April 2016) with a hypopnea requiring 4% desaturations.  The channels recorded and monitored were frontal, central and occipital EEG, electrooculogram (EOG), submentalis EMG (chin), nasal and oral airflow, thoracic and abdominal wall motion, anterior tibialis EMG, snore microphone, electrocardiogram, and pulse oximetry.  MEDICATIONS  amLODipine (NORVASC) 10 MG tablet  Medications self-administered by patient taken the night of the study : N/A  SLEEP ARCHITECTURE The study was initiated at 11:13:08 PM and ended at 5:21:28 AM.  Sleep onset time was 19.1 minutes and the sleep efficiency was 91.3%%. The total sleep time was 336.2 minutes.  Stage REM latency was 170.5 minutes.  The patient spent 5.1%% of the night in stage N1 sleep, 73.1%% in stage N2 sleep, 7.6%% in stage N3 and 14.28% in REM.  Alpha intrusion was absent.  Supine sleep was 55.04%.  RESPIRATORY PARAMETERS The overall apnea/hypopnea index (AHI) was 7.1 per hour.  The respiratory disturbance index was 7.3 per hour.  There were 2 total apneas, including 2 obstructive, 0 central and 0 mixed apneas. There were 38 hypopneas and 1 RERAs.  The AHI during Stage REM sleep was 20.0 per hour.  AHI while supine was 12.0 per hour.  The mean oxygen saturation was 93.0%. The minimum SpO2 during sleep was 86.0%.  Very loud snoring was noted during this  study.  CARDIAC DATA The 2 lead EKG demonstrated sinus rhythm. The mean heart rate was 60.2 beats per minute. Other EKG findings include: None.  LEG MOVEMENT DATA The total PLMS were 0 with a resulting PLMS index of 0.0. Associated arousal with leg movement index was 0.0 .  IMPRESSIONS - Mild obstructive sleep apnea overall (AHI 7.1/h);   However, sleep apnea more severe with supine sleep and moderate during REM sleep (AHI 20.0/h). - No significant central sleep apnea occurred during this study (CAI = 0.0/h). - Mild oxygen desaturation to a nadir of 86%. - The patient snored with very loud snoring volume. - No cardiac abnormalities were noted during this study. - Clinically significant periodic limb movements did not occur during sleep. No significant associated arousals.  DIAGNOSIS - Obstructive Sleep Apnea (327.23 [G47.33 ICD-10]) - Nocturnal Hypoxemia (327.26 [G47.36 ICD-10])  RECOMMENDATIONS - In this patient with cardiovascular comorbidities, recommend CPAP titration trial for optimal treatment of the patient's sleep-disordered breathing. - Effort should be made to optimize nasal and oral pharyngeal patency - Positional therapy avoiding supine position during sleep. - Avoid alcohol, sedatives and other CNS depressants that may worsen sleep apnea and disrupt normal sleep architecture. - Sleep hygiene should be reviewed to assess factors that may improve sleep quality. - Weight management (BMI 46) and regular exercise should be initiated.  [Electronically signed] 03/27/2018 09:55 PM  Nicki Guadalajara MD, [facility], ABSM Diplomate, American Board of Sleep Medicine   NPI: [id number] [facility] PH: [phone]   FX: [phone] ACCREDITED BY THE AMERICAN ACADEMY OF SLEEP MEDICINE

## 2018-03-29 ENCOUNTER — Telehealth: Payer: Self-pay | Admitting: *Deleted

## 2018-03-29 ENCOUNTER — Other Ambulatory Visit: Payer: Self-pay | Admitting: Cardiovascular Disease

## 2018-03-29 DIAGNOSIS — G4733 Obstructive sleep apnea (adult) (pediatric): Secondary | ICD-10-CM

## 2018-03-29 NOTE — Progress Notes (Signed)
Left message to return a call. 

## 2018-03-29 NOTE — Telephone Encounter (Signed)
Left message @ home # to have patient to return a call to me.

## 2018-03-30 NOTE — Telephone Encounter (Signed)
Called patient a second time. Could not leave message on cell #. No answer @ home #.

## 2018-03-31 ENCOUNTER — Encounter: Payer: Self-pay | Admitting: *Deleted

## 2018-04-05 ENCOUNTER — Encounter: Payer: Self-pay | Admitting: *Deleted

## 2018-04-05 NOTE — Telephone Encounter (Signed)
Letter sent to patient to call office for sleep study results and recommendations.

## 2018-04-20 NOTE — Progress Notes (Signed)
I have unsuccessfully tried to contact this patient by phone (3) times. A letter was mailed to the home address we have on file without response. No further attempts will be made at this time.

## 2018-06-17 ENCOUNTER — Other Ambulatory Visit: Payer: Self-pay

## 2018-06-17 ENCOUNTER — Encounter (HOSPITAL_COMMUNITY): Payer: Self-pay | Admitting: Emergency Medicine

## 2018-06-17 ENCOUNTER — Emergency Department (HOSPITAL_COMMUNITY)
Admission: EM | Admit: 2018-06-17 | Discharge: 2018-06-17 | Disposition: A | Discharge status: Home / Self Care | Payer: Self-pay | Attending: Emergency Medicine | Admitting: Emergency Medicine

## 2018-06-17 ENCOUNTER — Emergency Department (HOSPITAL_COMMUNITY)
Admission: EM | Admit: 2018-06-17 | Discharge: 2018-06-17 | Disposition: A | Discharge status: Home / Self Care | Payer: Self-pay

## 2018-06-17 ENCOUNTER — Ambulatory Visit (HOSPITAL_COMMUNITY)
Admission: EM | Admit: 2018-06-17 | Discharge: 2018-06-17 | Disposition: A | Discharge status: Home / Self Care | Payer: Self-pay

## 2018-06-17 DIAGNOSIS — W228XXA Striking against or struck by other objects, initial encounter: Secondary | ICD-10-CM | POA: Insufficient documentation

## 2018-06-17 DIAGNOSIS — I10 Essential (primary) hypertension: Secondary | ICD-10-CM | POA: Insufficient documentation

## 2018-06-17 DIAGNOSIS — J45909 Unspecified asthma, uncomplicated: Secondary | ICD-10-CM | POA: Insufficient documentation

## 2018-06-17 DIAGNOSIS — Y998 Other external cause status: Secondary | ICD-10-CM | POA: Insufficient documentation

## 2018-06-17 DIAGNOSIS — S0990XA Unspecified injury of head, initial encounter: Secondary | ICD-10-CM

## 2018-06-17 DIAGNOSIS — S0181XA Laceration without foreign body of other part of head, initial encounter: Secondary | ICD-10-CM | POA: Insufficient documentation

## 2018-06-17 DIAGNOSIS — F17210 Nicotine dependence, cigarettes, uncomplicated: Secondary | ICD-10-CM | POA: Insufficient documentation

## 2018-06-17 DIAGNOSIS — Y929 Unspecified place or not applicable: Secondary | ICD-10-CM | POA: Insufficient documentation

## 2018-06-17 DIAGNOSIS — Y9389 Activity, other specified: Secondary | ICD-10-CM | POA: Insufficient documentation

## 2018-06-17 DIAGNOSIS — Z79899 Other long term (current) drug therapy: Secondary | ICD-10-CM | POA: Insufficient documentation

## 2018-06-17 NOTE — Discharge Instructions (Signed)
Apply ice to the area and keep clean with soap and water Please return if worsening

## 2018-06-17 NOTE — ED Notes (Signed)
See providers notes

## 2018-06-17 NOTE — ED Provider Notes (Signed)
[facility] EMERGENCY DEPARTMENT Provider Note   CSN: [id number] Arrival date & time: 06/17/18  1836     History   Chief Complaint Chief Complaint  Patient presents with  . Head Injury    HPI Cameron Greene is a 32 y.o. male who presents with a head injury.  Past medical history significant for hypertension.  He states that he was at work when he leaned down and stood up and hit his head on a metal beam.  He did not feel like anything was wrong however his coworkers noticed that he was bleeding.  He went to the bathroom and the bleeding was controlled.  He was notified by his supervisor that he needed to go to urgent care to be medically cleared.  Patient reports feeling very fatigued but states that he works 60 hours a week and this is not uncommon.  He denies loss of consciousness, neck pain, dizziness, vision changes, nausea or vomiting.  Urgent care did not feel comfortable medically clearing him therefore he came to the emergency department.  HPI  Past Medical History:  Diagnosis Date  . Arthritis    "right knee" (02/02/2018)  . Asthma   . Gout   . Hypertension     Patient Active Problem List   Diagnosis Date Noted  . Precordial chest pain 02/02/2018  . HTN (hypertension) 02/02/2018    Past Surgical History:  Procedure Laterality Date  . CARDIAC CATHETERIZATION  02/02/2018  . KNEE ARTHROSCOPY Right ~ 2009   "shaved behind my kneecap"  . LEFT HEART CATH AND CORONARY ANGIOGRAPHY N/A 02/02/2018   Procedure: LEFT HEART CATH AND CORONARY ANGIOGRAPHY;  Surgeon: Sw[place]il[place], Peter M, MD;  Location: [facility] INVASIVE CV LAB;  Service: Cardiovascular;  Laterality: N/A;        Home Medications    Prior to Admission medications   Medication Sig Start Date End Date Taking? Authorizing Provider  amLODipine (NORVASC) 10 MG tablet Take 1 tablet (10 mg total) by mouth daily. 03/02/18   Jodelle Gross, NP    Family History Family History  Problem Relation Age of  Onset  . Hypertension Mother   . Asthma Mother     Social History Social History   Tobacco Use  . Smoking status: Current Every Day Smoker    Packs/day: 0.10    Years: 23.00    Pack years: 2.30    Types: Cigarettes  . Smokeless tobacco: Never Used  Substance Use Topics  . Alcohol use: Yes    Comment: 02/02/2018 "a few drinks/month"  . Drug use: Yes    Types: Marijuana    Comment: 02/02/2018 "daily"     Allergies   Bee venom; Fish allergy; Apple; and Banana   Review of Systems Review of Systems  Skin: Positive for wound.  Neurological: Negative for syncope.     Physical Exam Updated Vital Signs BP (!) 143/85 (BP Location: Right Arm)   Pulse 69   Temp 97.9 F (36.6 C) (Oral)   Resp 16   Ht 6' (1.829 m)   Wt (!) 146.1 kg   SpO2 98%   BMI 43.67 kg/m   Physical Exam  Constitutional: He is oriented to person, place, and time. He appears well-developed and well-nourished. No distress.  HENT:  Head: Normocephalic.  Right Ear: No hemotympanum.  Left Ear: No hemotympanum.  Very small hematoma over the right with a healed over superficial laceration which is mildly tender to palpation  Eyes: Pupils are equal,  round, and reactive to light. Conjunctivae are normal. Right eye exhibits no discharge. Left eye exhibits no discharge. No scleral icterus.  Neck: Normal range of motion.  No midline C-spine tenderness  Cardiovascular: Normal rate.  Pulmonary/Chest: Effort normal. No respiratory distress.  Abdominal: He exhibits no distension.  Neurological: He is alert and oriented to person, place, and time.  Lying on stretcher in NAD. GCS 15. Speaks in a clear voice. Cranial nerves II through XII grossly intact. 5/5 strength in all extremities. Sensation fully intact.  Bilateral finger-to-nose intact. Ambulatory    Skin: Skin is warm and dry.  Psychiatric: He has a normal mood and affect. His behavior is normal.  Nursing note and vitals reviewed.    ED Treatments /  Results  Labs (all labs ordered are listed, but only abnormal results are displayed) Labs Reviewed - No data to display  EKG None  Radiology No results found.  Procedures Procedures (including critical care time)  Medications Ordered in ED Medications - No data to display   Initial Impression / Assessment and Plan / ED Course  I have reviewed the triage vital signs and the nursing notes.  Pertinent labs & imaging results that were available during my care of the patient were reviewed by me and considered in my medical decision making (see chart for details).  32 year old male presents with minor head injury at work today.  It is a burn approximately 6 hours since the accident happened he has no significant symptoms.  He is mainly here because urgent care did not feel comfortable medically clearing him.  He is mildly hypertensive otherwise vital signs are normal.  He has a normal neurologic exam.  The wound over his forehead is superficial and does not need repair.  He was advised wound care, ice for the swelling and was given a work note.  Return precautions were given.  Final Clinical Impressions(s) / ED Diagnoses   Final diagnoses:  Minor head injury, initial encounter  Laceration of forehead, initial encounter    ED Discharge Orders    None       Bethel BornGekas, Trenton Verne Marie, PA-C 06/17/18 2207    Marily MemosMesner, Jason, MD 06/18/18 [phone]

## 2018-06-17 NOTE — ED Triage Notes (Signed)
Pt reports that he hit his head on a steel beam at work. Says that he was gathering tools, equipment when he stood up too quick and turned into the beam. Denies LOC. Needs medical clearance for work.

## 2018-06-17 NOTE — ED Notes (Signed)
Per pt, states came up from squatting position, hitting head on steel bar @ approx 1650 today.  Denies LOC or neck pain.  Pt has small laceration to right forehead without active bleeding.  C/O feeling "very sleepy and dizzy".  Denies nausea o4r vision changes.  C/O "little bit" of HA.  Discussed with Carollee Herter. Rossi, NP - instructed to have pt go to ED.

## 2018-11-13 ENCOUNTER — Emergency Department (HOSPITAL_COMMUNITY): Payer: Self-pay

## 2018-11-13 ENCOUNTER — Emergency Department (HOSPITAL_COMMUNITY)
Admission: EM | Admit: 2018-11-13 | Discharge: 2018-11-13 | Discharge status: Against Medical Advice | Payer: Self-pay | Attending: Emergency Medicine | Admitting: Emergency Medicine

## 2018-11-13 ENCOUNTER — Encounter (HOSPITAL_COMMUNITY): Payer: Self-pay | Admitting: *Deleted

## 2018-11-13 ENCOUNTER — Other Ambulatory Visit: Payer: Self-pay

## 2018-11-13 DIAGNOSIS — Z5321 Procedure and treatment not carried out due to patient leaving prior to being seen by health care provider: Secondary | ICD-10-CM | POA: Insufficient documentation

## 2018-11-13 DIAGNOSIS — R079 Chest pain, unspecified: Secondary | ICD-10-CM | POA: Insufficient documentation

## 2018-11-13 LAB — CBC
HCT: 39.3 % (ref 39.0–52.0)
Hemoglobin: 13.5 g/dL (ref 13.0–17.0)
MCH: 30.8 pg (ref 26.0–34.0)
MCHC: 34.4 g/dL (ref 30.0–36.0)
MCV: 89.7 fL (ref 80.0–100.0)
NRBC: 0 % (ref 0.0–0.2)
PLATELETS: 181 10*3/uL (ref 150–400)
RBC: 4.38 MIL/uL (ref 4.22–5.81)
RDW: 11.7 % (ref 11.5–15.5)
WBC: 9 10*3/uL (ref 4.0–10.5)

## 2018-11-13 LAB — BASIC METABOLIC PANEL
Anion gap: 10 (ref 5–15)
BUN: 15 mg/dL (ref 6–20)
CALCIUM: 9.1 mg/dL (ref 8.9–10.3)
CO2: 23 mmol/L (ref 22–32)
CREATININE: 1.25 mg/dL — AB (ref 0.61–1.24)
Chloride: 106 mmol/L (ref 98–111)
GFR calc Af Amer: >60 mL/min (ref >60)
Glucose, Bld: 99 mg/dL (ref 70–99)
POTASSIUM: 3.5 mmol/L (ref 3.5–5.1)
Sodium: 139 mmol/L (ref 135–145)

## 2018-11-13 LAB — POCT I-STAT TROPONIN I: TROPONIN I, POC: 0.01 ng/mL (ref 0.00–0.08)

## 2018-11-13 NOTE — ED Notes (Signed)
Pt was said to have walked out and left.

## 2018-11-13 NOTE — ED Triage Notes (Signed)
Pt c/o chest pain on and off x 1 week.  Pt indicates mid chest radiating into left chest.  Pt denies n/v.

## 2019-04-08 IMAGING — CR DG CHEST 2V
2 series · 2 of 2 positions shown · non-contrast
Comparison: Chest radiograph performed 12/08/2016

CLINICAL DATA: Acute onset of intermittent mid chest pain,
radiating to the left side of the chest. Congestion and productive
cough. Fever and chills.

EXAM:
CHEST - 2 VIEW

[w chest pa]
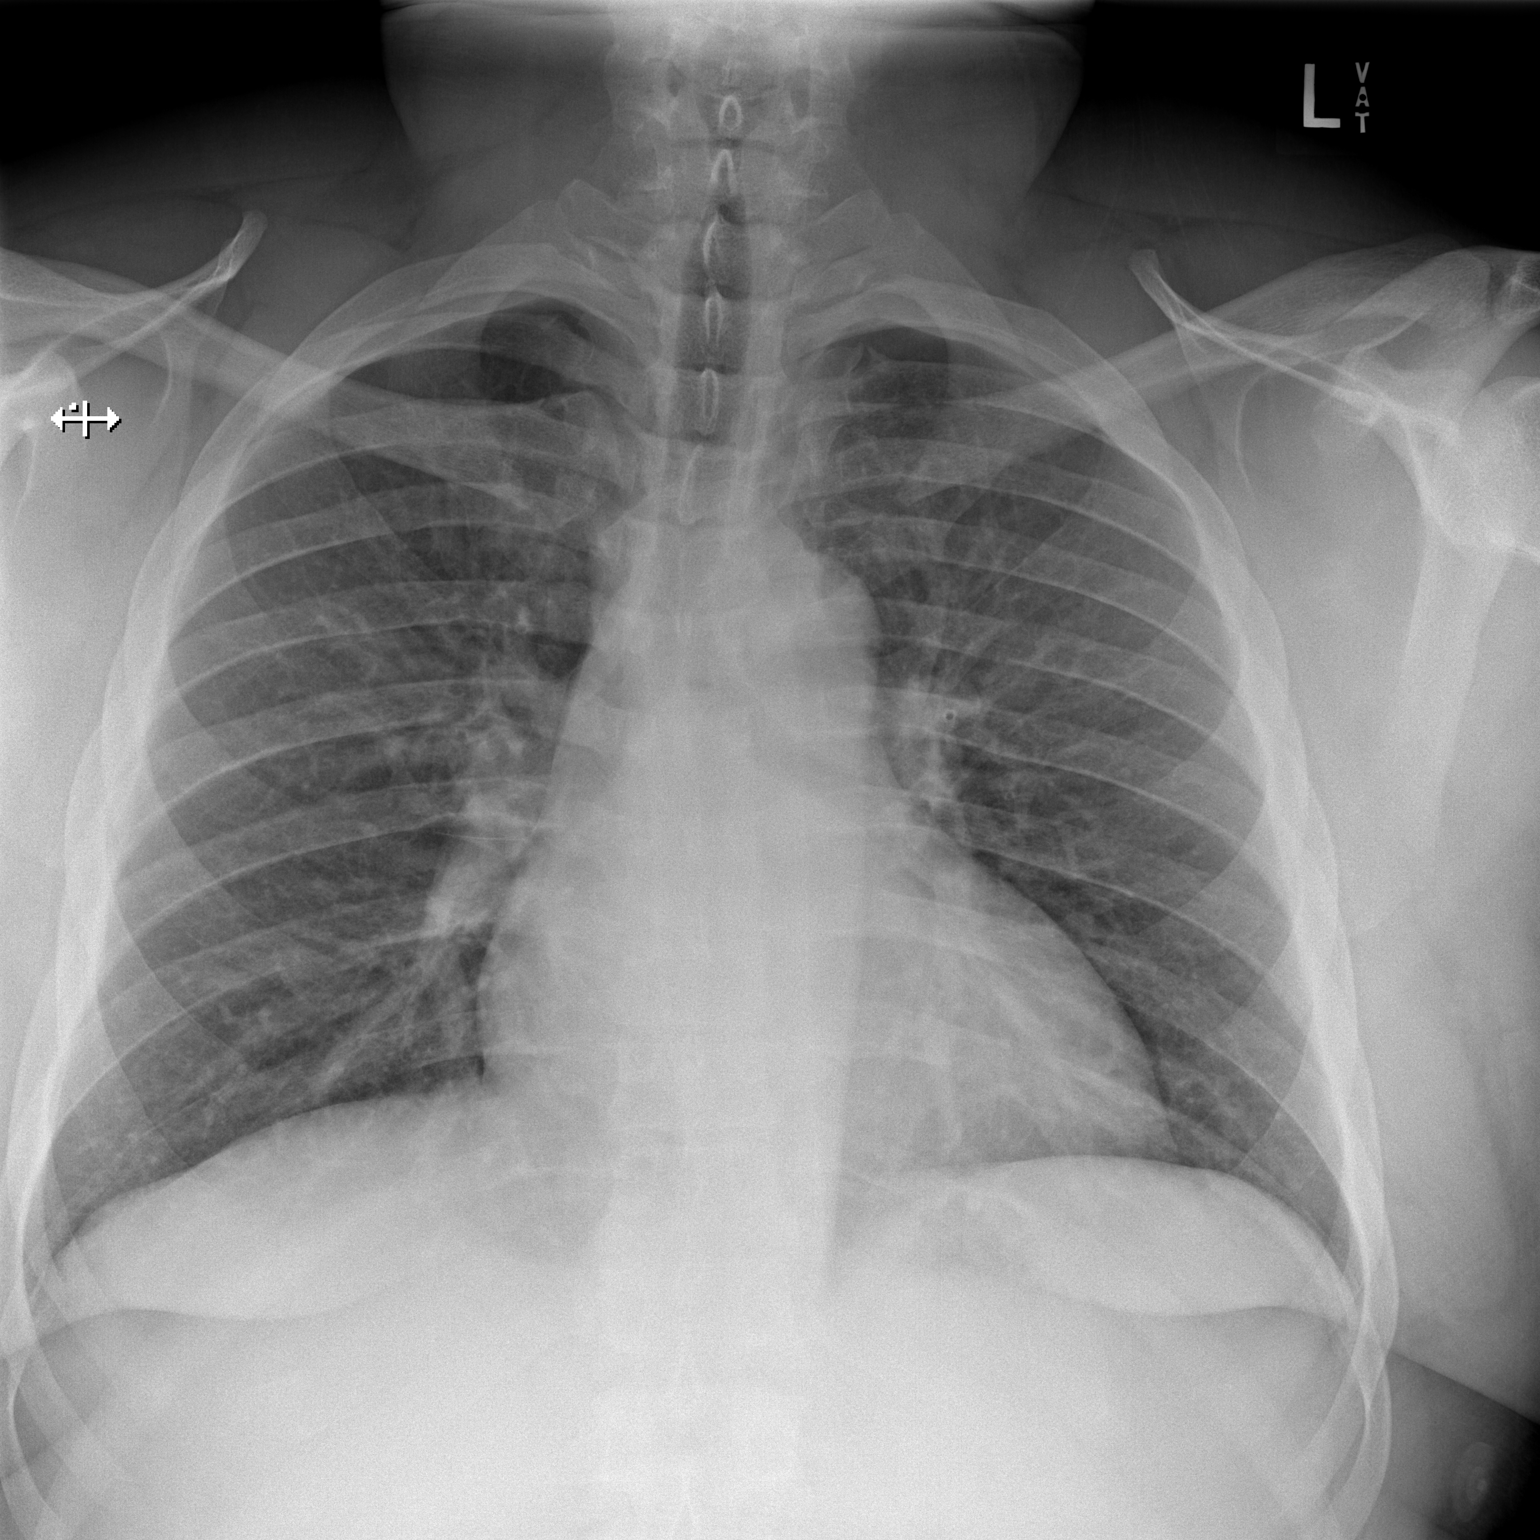

[w chest lat]
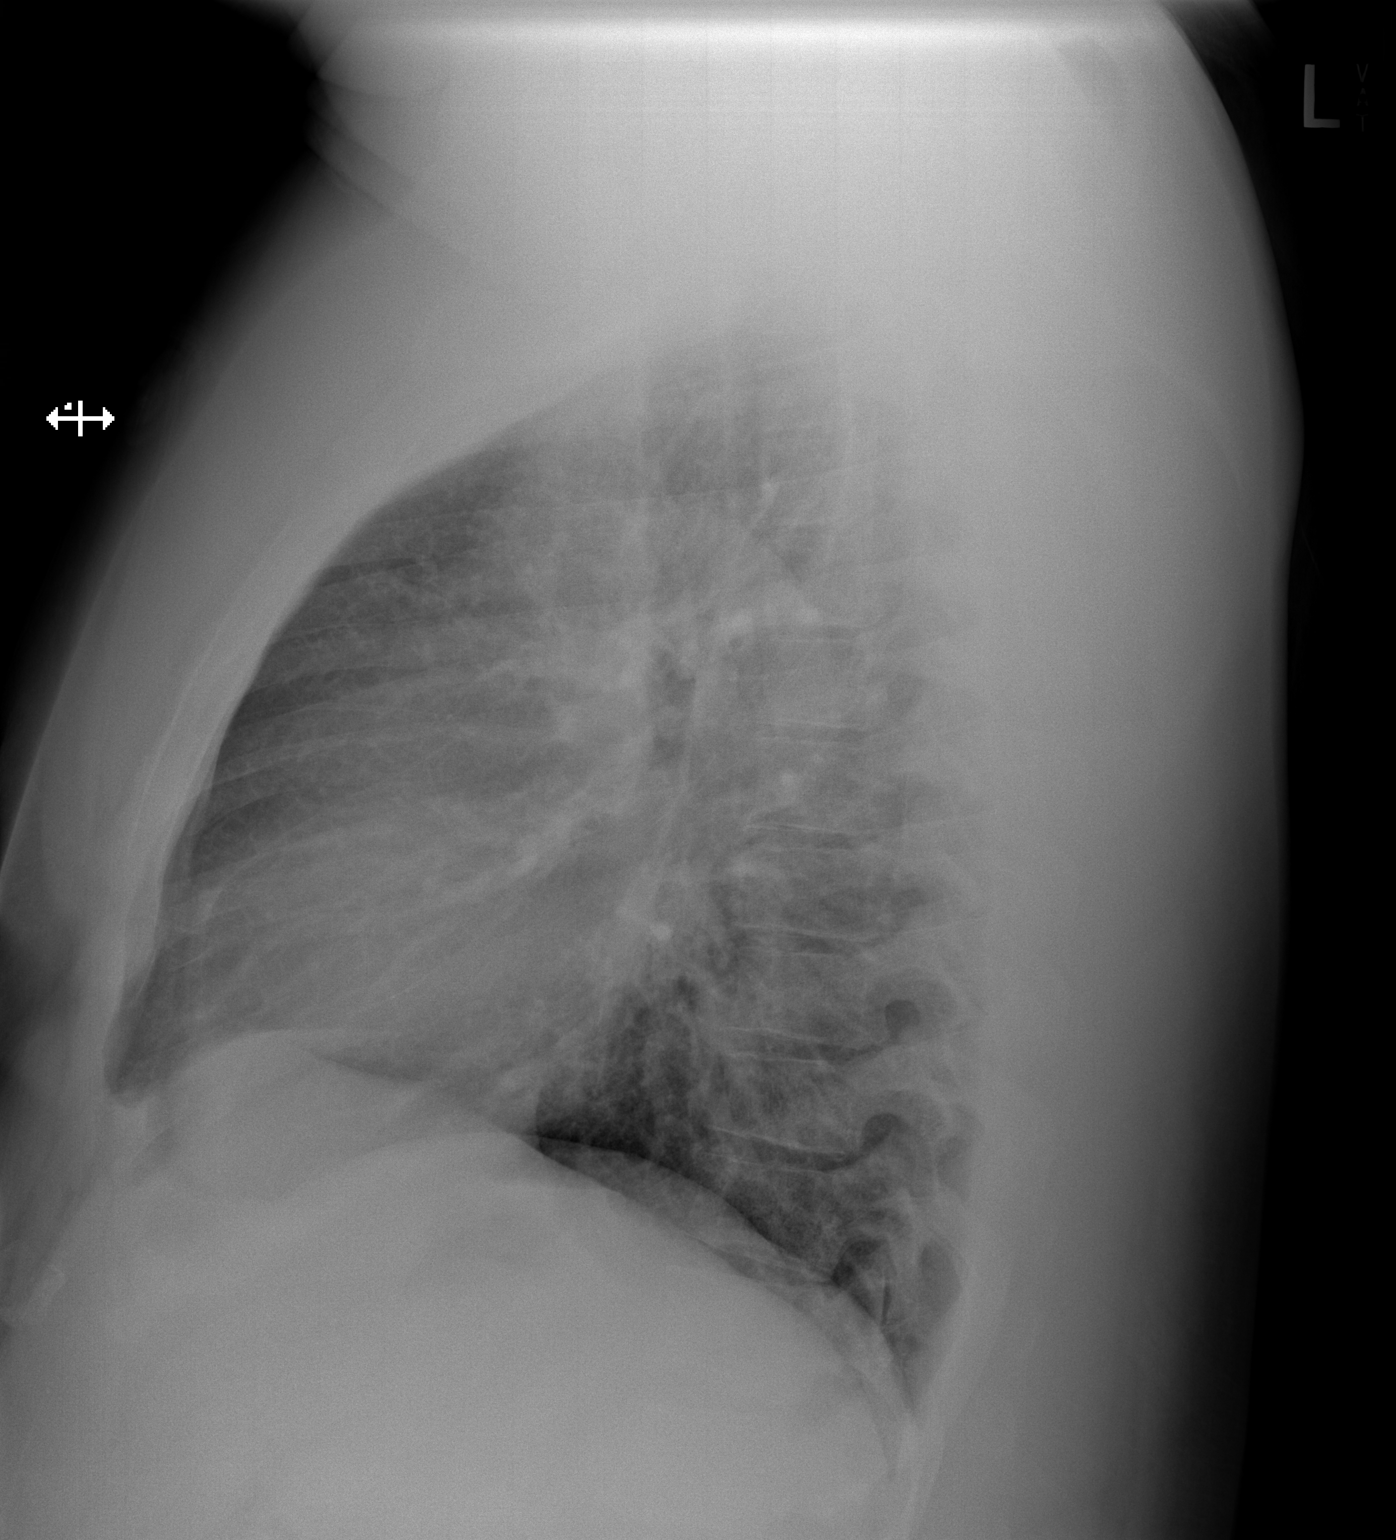

[2 of 2 positions shown; findings below may reference images not displayed]

FINDINGS: The lungs are well-aerated. Pulmonary vascularity is at the upper
limits of normal. There is no evidence of focal opacification,
pleural effusion or pneumothorax.

The heart is normal in size; the mediastinal contour is within
normal limits. No acute osseous abnormalities are seen.
IMPRESSION: No acute cardiopulmonary process seen.

## 2022-08-26 ENCOUNTER — Emergency Department (HOSPITAL_COMMUNITY): Payer: Self-pay

## 2022-08-26 ENCOUNTER — Inpatient Hospital Stay (HOSPITAL_COMMUNITY)
Admission: EM | Admit: 2022-08-26 | Discharge: 2022-08-27 | DRG: 418 | Disposition: A | Discharge status: Home / Self Care | Payer: Self-pay | Attending: Surgery | Admitting: Surgery

## 2022-08-26 ENCOUNTER — Other Ambulatory Visit: Payer: Self-pay

## 2022-08-26 ENCOUNTER — Encounter (HOSPITAL_COMMUNITY): Payer: Self-pay

## 2022-08-26 DIAGNOSIS — F1490 Cocaine use, unspecified, uncomplicated: Secondary | ICD-10-CM | POA: Diagnosis present

## 2022-08-26 DIAGNOSIS — Z825 Family history of asthma and other chronic lower respiratory diseases: Secondary | ICD-10-CM

## 2022-08-26 DIAGNOSIS — Z9049 Acquired absence of other specified parts of digestive tract: Secondary | ICD-10-CM

## 2022-08-26 DIAGNOSIS — Z91018 Allergy to other foods: Secondary | ICD-10-CM

## 2022-08-26 DIAGNOSIS — G4733 Obstructive sleep apnea (adult) (pediatric): Secondary | ICD-10-CM | POA: Diagnosis present

## 2022-08-26 DIAGNOSIS — Z91030 Bee allergy status: Secondary | ICD-10-CM

## 2022-08-26 DIAGNOSIS — K810 Acute cholecystitis: Secondary | ICD-10-CM | POA: Diagnosis present

## 2022-08-26 DIAGNOSIS — Z6841 Body Mass Index (BMI) 40.0 and over, adult: Secondary | ICD-10-CM

## 2022-08-26 DIAGNOSIS — Z8249 Family history of ischemic heart disease and other diseases of the circulatory system: Secondary | ICD-10-CM

## 2022-08-26 DIAGNOSIS — M1990 Unspecified osteoarthritis, unspecified site: Secondary | ICD-10-CM | POA: Diagnosis present

## 2022-08-26 DIAGNOSIS — I4510 Unspecified right bundle-branch block: Secondary | ICD-10-CM | POA: Diagnosis present

## 2022-08-26 DIAGNOSIS — F17210 Nicotine dependence, cigarettes, uncomplicated: Secondary | ICD-10-CM | POA: Diagnosis present

## 2022-08-26 DIAGNOSIS — I422 Other hypertrophic cardiomyopathy: Secondary | ICD-10-CM | POA: Diagnosis present

## 2022-08-26 DIAGNOSIS — K8012 Calculus of gallbladder with acute and chronic cholecystitis without obstruction: Principal | ICD-10-CM | POA: Diagnosis present

## 2022-08-26 DIAGNOSIS — Z91148 Patient's other noncompliance with medication regimen for other reason: Secondary | ICD-10-CM

## 2022-08-26 DIAGNOSIS — K8000 Calculus of gallbladder with acute cholecystitis without obstruction: Principal | ICD-10-CM

## 2022-08-26 DIAGNOSIS — R079 Chest pain, unspecified: Secondary | ICD-10-CM

## 2022-08-26 DIAGNOSIS — I10 Essential (primary) hypertension: Secondary | ICD-10-CM | POA: Diagnosis present

## 2022-08-26 DIAGNOSIS — F1290 Cannabis use, unspecified, uncomplicated: Secondary | ICD-10-CM | POA: Diagnosis present

## 2022-08-26 DIAGNOSIS — J45909 Unspecified asthma, uncomplicated: Secondary | ICD-10-CM | POA: Diagnosis present

## 2022-08-26 DIAGNOSIS — E669 Obesity, unspecified: Secondary | ICD-10-CM | POA: Diagnosis present

## 2022-08-26 LAB — CBC
HCT: 37.8 % — ABNORMAL LOW (ref 39.0–52.0)
Hemoglobin: 13.4 g/dL (ref 13.0–17.0)
MCH: 30.9 pg (ref 26.0–34.0)
MCHC: 35.4 g/dL (ref 30.0–36.0)
MCV: 87.1 fL (ref 80.0–100.0)
Platelets: 192 10*3/uL (ref 150–400)
RBC: 4.34 MIL/uL (ref 4.22–5.81)
RDW: 11.8 % (ref 11.5–15.5)
WBC: 8.7 10*3/uL (ref 4.0–10.5)
nRBC: 0 % (ref 0.0–0.2)

## 2022-08-26 LAB — COMPREHENSIVE METABOLIC PANEL
ALT: 44 U/L (ref 0–44)
AST: 27 U/L (ref 15–41)
Albumin: 4 g/dL (ref 3.5–5.0)
Alkaline Phosphatase: 125 U/L (ref 38–126)
Anion gap: 8 (ref 5–15)
BUN: 15 mg/dL (ref 6–20)
CO2: 24 mmol/L (ref 22–32)
Calcium: 9.1 mg/dL (ref 8.9–10.3)
Chloride: 106 mmol/L (ref 98–111)
Creatinine, Ser: 1.34 mg/dL — ABNORMAL HIGH (ref 0.61–1.24)
GFR, Estimated: >60 mL/min (ref >60)
Glucose, Bld: 111 mg/dL — ABNORMAL HIGH (ref 70–99)
Potassium: 3.6 mmol/L (ref 3.5–5.1)
Sodium: 138 mmol/L (ref 135–145)
Total Bilirubin: 0.5 mg/dL (ref 0.3–1.2)
Total Protein: 6.9 g/dL (ref 6.5–8.1)

## 2022-08-26 LAB — TROPONIN I (HIGH SENSITIVITY)
Troponin I (High Sensitivity): 14 ng/L (ref <18)
Troponin I (High Sensitivity): 12 ng/L (ref <18)

## 2022-08-26 LAB — LIPASE, BLOOD: Lipase: 41 U/L (ref 11–51)

## 2022-08-26 MED ORDER — HYDRALAZINE HCL 20 MG/ML IJ SOLN
10.0000 mg | INTRAMUSCULAR | Status: DC | PRN
  Administered 2022-08-26 (×2): 10 mg via INTRAVENOUS
  Filled 2022-08-26 (×2): qty 1

## 2022-08-26 MED ORDER — ONDANSETRON HCL 4 MG/2ML IJ SOLN
4.0000 mg | Freq: Once | INTRAMUSCULAR | Status: AC
  Administered 2022-08-26: 4 mg via INTRAVENOUS
  Filled 2022-08-26: qty 2

## 2022-08-26 MED ORDER — AMLODIPINE BESYLATE 5 MG PO TABS
10.0000 mg | ORAL_TABLET | Freq: Every day | ORAL | Status: DC
  Administered 2022-08-27: 10 mg via ORAL
  Filled 2022-08-26: qty 2

## 2022-08-26 MED ORDER — ONDANSETRON HCL 4 MG/2ML IJ SOLN: 4.0000 mg | Freq: Four times a day (QID) | INTRAMUSCULAR | Status: DC | PRN

## 2022-08-26 MED ORDER — OXYCODONE HCL 5 MG PO TABS: 5.0000 mg | ORAL_TABLET | ORAL | Status: DC | PRN

## 2022-08-26 MED ORDER — IOHEXOL 350 MG/ML SOLN
100.0000 mL | Freq: Once | INTRAVENOUS | Status: AC | PRN
  Administered 2022-08-26: 100 mL via INTRAVENOUS

## 2022-08-26 MED ORDER — PANTOPRAZOLE SODIUM 40 MG IV SOLR
40.0000 mg | Freq: Every day | INTRAVENOUS | Status: DC
  Administered 2022-08-26: 40 mg via INTRAVENOUS
  Filled 2022-08-26: qty 10

## 2022-08-26 MED ORDER — ACETAMINOPHEN 325 MG PO TABS: 650.0000 mg | ORAL_TABLET | Freq: Four times a day (QID) | ORAL | Status: DC | PRN

## 2022-08-26 MED ORDER — METHOCARBAMOL 1000 MG/10ML IJ SOLN: 500.0000 mg | Freq: Three times a day (TID) | INTRAVENOUS | Status: DC | PRN

## 2022-08-26 MED ORDER — ENOXAPARIN SODIUM 40 MG/0.4ML IJ SOSY
40.0000 mg | PREFILLED_SYRINGE | INTRAMUSCULAR | Status: DC
  Administered 2022-08-26: 40 mg via SUBCUTANEOUS
  Filled 2022-08-26: qty 0.4

## 2022-08-26 MED ORDER — PROCHLORPERAZINE EDISYLATE 10 MG/2ML IJ SOLN: 5.0000 mg | Freq: Four times a day (QID) | INTRAMUSCULAR | Status: DC | PRN

## 2022-08-26 MED ORDER — METHOCARBAMOL 500 MG PO TABS
500.0000 mg | ORAL_TABLET | Freq: Three times a day (TID) | ORAL | Status: DC | PRN
  Filled 2022-08-26: qty 1

## 2022-08-26 MED ORDER — SODIUM CHLORIDE 0.9 % IV SOLN: INTRAVENOUS | Status: DC

## 2022-08-26 MED ORDER — SODIUM CHLORIDE 0.9 % IV SOLN
2.0000 g | INTRAVENOUS | Status: DC
  Administered 2022-08-26: 2 g via INTRAVENOUS
  Filled 2022-08-26: qty 20

## 2022-08-26 MED ORDER — ONDANSETRON 4 MG PO TBDP: 4.0000 mg | ORAL_TABLET | Freq: Four times a day (QID) | ORAL | Status: DC | PRN

## 2022-08-26 MED ORDER — DIPHENHYDRAMINE HCL 25 MG PO CAPS: 25.0000 mg | ORAL_CAPSULE | Freq: Four times a day (QID) | ORAL | Status: DC | PRN

## 2022-08-26 MED ORDER — SIMETHICONE 80 MG PO CHEW: 40.0000 mg | CHEWABLE_TABLET | Freq: Four times a day (QID) | ORAL | Status: DC | PRN

## 2022-08-26 MED ORDER — DIPHENHYDRAMINE HCL 50 MG/ML IJ SOLN: 25.0000 mg | Freq: Four times a day (QID) | INTRAMUSCULAR | Status: DC | PRN

## 2022-08-26 MED ORDER — ACETAMINOPHEN 650 MG RE SUPP: 650.0000 mg | Freq: Four times a day (QID) | RECTAL | Status: DC | PRN

## 2022-08-26 MED ORDER — POLYETHYLENE GLYCOL 3350 17 G PO PACK: 17.0000 g | PACK | Freq: Every day | ORAL | Status: DC | PRN

## 2022-08-26 MED ORDER — DOCUSATE SODIUM 100 MG PO CAPS
100.0000 mg | ORAL_CAPSULE | Freq: Two times a day (BID) | ORAL | Status: DC
  Administered 2022-08-27: 100 mg via ORAL
  Filled 2022-08-26 (×2): qty 1

## 2022-08-26 MED ORDER — MORPHINE SULFATE (PF) 4 MG/ML IV SOLN
6.0000 mg | Freq: Once | INTRAVENOUS | Status: AC
  Administered 2022-08-26: 6 mg via INTRAVENOUS
  Filled 2022-08-26: qty 2

## 2022-08-26 MED ORDER — AMLODIPINE BESYLATE 5 MG PO TABS
10.0000 mg | ORAL_TABLET | Freq: Once | ORAL | Status: AC
  Administered 2022-08-26: 10 mg via ORAL
  Filled 2022-08-26: qty 2

## 2022-08-26 MED ORDER — NITROGLYCERIN 0.4 MG SL SUBL: 0.4000 mg | SUBLINGUAL_TABLET | SUBLINGUAL | Status: DC | PRN

## 2022-08-26 MED ORDER — PROCHLORPERAZINE MALEATE 5 MG PO TABS: 10.0000 mg | ORAL_TABLET | Freq: Four times a day (QID) | ORAL | Status: DC | PRN

## 2022-08-26 MED ORDER — MORPHINE SULFATE (PF) 2 MG/ML IV SOLN: 2.0000 mg | INTRAVENOUS | Status: DC | PRN

## 2022-08-26 MED ORDER — METOPROLOL TARTRATE 5 MG/5ML IV SOLN
5.0000 mg | Freq: Four times a day (QID) | INTRAVENOUS | Status: DC | PRN
  Administered 2022-08-27: 5 mg via INTRAVENOUS
  Filled 2022-08-26: qty 5

## 2022-08-26 NOTE — ED Provider Notes (Signed)
  Physical Exam  BP (!) 151/87   Pulse (!) 53   Temp 97.8 F (36.6 C) (Oral)   Resp 18   Ht 6' (1.829 m)   Wt (!) 147.4 kg   SpO2 96%   BMI 44.08 kg/m   Physical Exam  Procedures  Procedures  ED Course / MDM    Medical Decision Making Amount and/or Complexity of Data Reviewed Labs: ordered. Radiology: ordered.  Risk Prescription drug management.   Accepted handoff at shift change from [facility], [place]. Please see prior provider note for more detail.   Briefly: Patient is 36 y.o. M presents with left chest pain.   DDX: concern for cholecystectomy versus outpatient follow-up.  Plan: Follow-up with surgery   Clenton Pare will admit the patient for a cholecystectomy.       Sherrell Puller, PA-C 08/26/22 1605    Tretha Sciara, MD 08/26/22 2135

## 2022-08-26 NOTE — ED Provider Notes (Signed)
[facility] EMERGENCY DEPARTMENT Provider Note   CSN: [id number] Arrival date & time: 08/26/22  1005     History  Chief Complaint  Patient presents with   Chest Pain    Cameron Greene is a 36 y.o. male.  Patient with history of hypertension although not currently on medication for this, history of hypertrophic cardiomyopathy evaluated and diagnosed during catheterization and echocardiogram in 2019, echo at that time showed diastolic dysfunction, catheterization showed normal coronary arteries --presents to the emergency department today for evaluation of acute onset left-sided chest pain without radiation.  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  He had acute onset of severe left-sided pain.  EMS was called for transport.  Patient received aspirin and 1 nitroglycerin prior to arrival without improvement.  Pain does not radiate to the back, arms, neck or elsewhere.  No lightheadedness or syncope.  No weakness, numbness, or tingling in the arms or the legs.  No strokelike symptoms or vision changes.  Patient has not arrived as STEMI, but was canceled shortly after arrival due to unchanged EKG.  Patient denies drugs or alcohol.      Home Medications Prior to Admission medications   Medication Sig Start Date End Date Taking? Authorizing Provider  amLODipine (NORVASC) 10 MG tablet Take 1 tablet (10 mg total) by mouth daily. 03/02/18   Lendon Colonel, NP      Allergies    Bee venom, Fish allergy, Apple juice, and Banana    Review of Systems   Review of Systems  Physical Exam Updated Vital Signs BP (!) 205/113 (BP Location: Right Arm)   Pulse 67   Temp 98.4 F (36.9 C) (Oral)   Resp (!) 22   Ht 6' (1.829 m)   Wt (!) 147.4 kg   SpO2 96%   BMI 44.08 kg/m   Physical Exam Vitals and nursing note reviewed.  Constitutional:      General: He is in acute distress.     Appearance: He is  well-developed. He is not diaphoretic.     Comments: Patient appears uncomfortable.  HENT:     Head: Normocephalic and atraumatic.     Mouth/Throat:     Mouth: Mucous membranes are not dry.  Eyes:     Conjunctiva/sclera: Conjunctivae normal.  Neck:     Vascular: Normal carotid pulses. No carotid bruit or JVD.     Trachea: Trachea normal. No tracheal deviation.  Cardiovascular:     Rate and Rhythm: Normal rate and regular rhythm.     Pulses: No decreased pulses.          Radial pulses are 2+ on the right side and 2+ on the left side.       Dorsalis pedis pulses are 2+ on the right side.     Heart sounds: Normal heart sounds, S1 normal and S2 normal. Heart sounds not distant. No murmur heard.    Comments: Patient with easily palpable symmetric radial and DP pulses bilaterally. Pulmonary:     Effort: Pulmonary effort is normal. No respiratory distress.     Breath sounds: No wheezing, rhonchi or rales.  Chest:     Chest wall: No tenderness.  Abdominal:     General: Bowel sounds are normal.     Palpations: Abdomen is soft.     Tenderness: There is no abdominal tenderness. There is no guarding or rebound.  Musculoskeletal:  Cervical back: Normal range of motion and neck supple. No muscular tenderness.     Right lower leg: No edema.     Left lower leg: No edema.  Skin:    General: Skin is warm and dry.     Coloration: Skin is not pale.  Neurological:     Mental Status: He is alert. Mental status is at baseline.     Comments: Normal strength upper and lower extremities without obvious deficit.  Psychiatric:        Mood and Affect: Mood normal.    ED Results / Procedures / Treatments   Labs (all labs ordered are listed, but only abnormal results are displayed) Labs Reviewed  CBC - Abnormal; Notable for the following components:      Result Value   HCT 37.8 (*)    All other components within normal limits  COMPREHENSIVE METABOLIC PANEL - Abnormal; Notable for the following  components:   Glucose, Bld 111 (*)    Creatinine, Ser 1.34 (*)    All other components within normal limits  LIPASE, BLOOD  TROPONIN I (HIGH SENSITIVITY)  TROPONIN I (HIGH SENSITIVITY)    EKG EKG Interpretation  Date/Time:  Wednesday August 26 2022 10:17:21 EDT Ventricular Rate:  72 PR Interval:  184 QRS Duration: 106 QT Interval:  396 QTC Calculation: 433 R Axis:   -35 Text Interpretation: Normal sinus rhythm Left axis deviation Incomplete right bundle branch block Left ventricular hypertrophy ( R in aVL , Cornell product , Romhilt-Estes ) Nonspecific T wave abnormality Abnormal ECG No significant change since last tracing Confirmed by Leanord Asal ([id number]) on 08/26/2022 10:44:45 AM  Radiology CT Angio Chest/Abd/Pel for Dissection W and/or Wo Contrast  Result Date: 08/26/2022 CLINICAL DATA:  Chest pain, back pain EXAM: CT ANGIOGRAPHY CHEST, ABDOMEN AND PELVIS TECHNIQUE: Non-contrast CT of the chest was initially obtained. Multidetector CT imaging through the chest, abdomen and pelvis was performed using the standard protocol during bolus administration of intravenous contrast. Multiplanar reconstructed images and MIPs were obtained and reviewed to evaluate the vascular anatomy. RADIATION DOSE REDUCTION: This exam was performed according to the departmental dose-optimization program which includes automated exposure control, adjustment of the mA and/or kV according to patient size and/or use of iterative reconstruction technique. CONTRAST:  158mL OMNIPAQUE IOHEXOL 350 MG/ML SOLN COMPARISON:  Chest radiograph done earlier today FINDINGS: CTA CHEST FINDINGS Cardiovascular: There is no demonstrable mural hematoma in noncontrast images off thoracic aorta. There is homogeneous enhancement in thoracic aorta. There is no demonstrable intimal flap. Major branches of thoracic aorta appear patent. There are no intraluminal filling defects in central pulmonary artery branches. Heart is enlarged in  size. Mediastinum/Nodes: No significant lymphadenopathy is seen. Lungs/Pleura: There is no focal pulmonary consolidation. There is no pleural effusion or pneumothorax. Musculoskeletal: Unremarkable. Review of the MIP images confirms the above findings. CTA ABDOMEN AND PELVIS FINDINGS VASCULAR Aorta: Appears normal Celiac: Appears normal SMA: Appears normal Renals: Appear normal IMA: Patent Inflow: Unremarkable. Veins: Unremarkable. Review of the MIP images confirms the above findings. NON-VASCULAR Hepatobiliary: There is marked fatty infiltration. There is no dilation of bile ducts. There is a large gallbladder stone. There is mild wall thickening in gallbladder. There is possible minimal amount of pericholecystic fluid. Pancreas: Unremarkable. Spleen: Unremarkable. Adrenals/Urinary Tract: Adrenals are unremarkable. There is no hydronephrosis. There are no renal or ureteral stones. Urinary bladder is distended. There is no wall thickening in the bladder. Stomach/Bowel: Stomach is not distended. Small bowel loops are not dilated. Appendix  is not dilated. There is no wall thickening in colon. There is no pericolic stranding. Lymphatic: No significant lymphadenopathy seen. Reproductive: Unremarkable. Other: There is no ascites or pneumoperitoneum. Musculoskeletal: Degenerative changes are noted in lumbar spine, more severe at L4-L5 and L5-S1 levels. There is encroachment of neural foramina by bony spurs at multiple levels in lumbar spine. Review of the MIP images confirms the above findings. IMPRESSION: There is no evidence of dissection in thoracic and abdominal aorta. There is no evidence of central pulmonary artery embolism. Heart is enlarged in size. There is no focal pulmonary consolidation. Large gallbladder stone. There is wall thickening and possible minimal pericholecystic fluid. Please correlate for possible acute cholecystitis and consider gallbladder sonogram. There is no evidence of intestinal obstruction  or pneumoperitoneum. There is no hydronephrosis. Appendix is not dilated. Fatty liver. Lumbar spondylosis with encroachment of neural foramina at multiple levels. Other findings as described in the body of the report. Electronically Signed   By: Elmer Picker M.D.   On: 08/26/2022 13:19   DG Chest Port 1 View  Result Date: 08/26/2022 CLINICAL DATA:  Chest pain beginning today EXAM: PORTABLE CHEST 1 VIEW COMPARISON:  11/13/2018 FINDINGS: Cardiomegaly. Mediastinal shadows are normal. 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 No infiltrate, collapse or effusion. IMPRESSION: Cardiomegaly. 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 Electronically Signed   By: Nelson Chimes M.D.   On: 08/26/2022 11:11    Procedures Procedures    Medications Ordered in ED Medications  nitroGLYCERIN (NITROSTAT) SL tablet 0.4 mg (has no administration in time range)  morphine (PF) 4 MG/ML injection 6 mg (6 mg Intravenous Given 08/26/22 1049)  ondansetron (ZOFRAN) injection 4 mg (4 mg Intravenous Given 08/26/22 1048)  amLODipine (NORVASC) tablet 10 mg (10 mg Oral Given 08/26/22 1134)  iohexol (OMNIPAQUE) 350 MG/ML injection 100 mL (100 mLs Intravenous Contrast Given 08/26/22 1300)    ED Course/ Medical Decision Making/ A&P    Patient seen and examined. History obtained directly from patient and from EMS report.  EKG reviewed and interpreted.  Agree, changes present in 2020 and no reciprocal depression today.    Labs/EKG: Ordered CBC, CMP, troponin.  Imaging: Ordered chest x-ray.  Medications/Fluids: Ordered: Morphine, sublingual nitroglycerin.   Most recent vital signs reviewed and are as follows: BP (!) 205/113 (BP Location: Right Arm)   Pulse 67   Temp 98.4 F (36.9 C) (Oral)   Resp (!) 22   Ht 6' (1.829 m)   Wt (!) 147.4 kg   SpO2 96%   BMI 44.08 kg/m   Initial impression: Left-sided chest pain, uncontrolled hypertension.  We will consider imaging of the aorta to rule out  dissection.  2:09 PM Reassessment performed. Patient appears comfortable, drowsy after IV pain medication.  Family now at bedside.  Labs personally reviewed and interpreted including: CBC normal at 8.7, normal hemoglobin; CMP creatinine mildly elevated at 1.34 slightly above baseline from a few years ago, glucose 111 otherwise unremarkable; troponin 14, pending second marker.  Imaging personally visualized and interpreted including: Chest x-ray, agree no infiltrate.  CT imaging was ordered of the chest, abdomen, and pelvis to evaluate for possibility of dissection or bowel perforation.  These were not present, however patient does have a large gallstone with some evidence of cholecystitis.  Ultrasound right upper quadrant ordered to further evaluate.  Reviewed pertinent lab work and imaging with patient at bedside. Questions answered.   Most current vital signs reviewed and are as follows: BP (!) 151/99   Pulse  62   Temp 98.4 F (36.9 C) (Oral)   Resp 17   Ht 6' (1.829 m)   Wt (!) 147.4 kg   SpO2 95%   BMI 44.08 kg/m   Plan: Second troponin, right upper quadrant ultrasound.  Blood pressure has improved with control of patient's pain.  Patient has been seen by Dr. Maylon Peppers as well he was ordered home amlodipine which will need to be restarted on discharge.  3:31 PM Reassessment performed. Patient continues to appear comfortable.  Patient and family ember updated on results and plan to this point.  Labs personally reviewed and interpreted including: Second troponin is 12.  Imaging personally visualized and interpreted including: Right upper quadrant ultrasound, agree gallstone.  Reviewed pertinent lab work and imaging with patient at bedside. Questions answered.   Most current vital signs reviewed and are as follows: BP (!) 168/96   Pulse (!) 56   Temp 97.8 F (36.6 C) (Oral)   Resp 14   Ht 6' (1.829 m)   Wt (!) 147.4 kg   SpO2 95%   BMI 44.08 kg/m   Plan: Surgical consult  given concern for acute cholecystitis with gallstone in the gallbladder neck.  Currently blood pressures are improved and pain is controlled.  Signout to [employer].  Dispo per surgical recommendation.  If discharged home, will need prescription for amlodipine 10 mg as well as outpatient surgical follow-up.                              Medical Decision Making Amount and/or Complexity of Data Reviewed Labs: ordered. Radiology: ordered.  Risk Prescription drug management.   For this patient's complaint of chest pain, the following emergent conditions were considered on the differential diagnosis: acute coronary syndrome, pulmonary embolism, pneumothorax, myocarditis, pericardial tamponade, aortic dissection, thoracic aortic aneurysm complication, esophageal perforation.   Other causes were also considered including: gastroesophageal reflux disease, musculoskeletal pain including costochondritis, pneumonia/pleurisy, herpes zoster, pericarditis.  In regards to possibility of ACS, two troponins were negative.  Patient has a grossly abnormal EKG, but unchanged from previous.  Low concern for ACS at this time.  In regards to possibility of PE, CTA of the chest, abdomen, pelvis negative for PE.   For this patient's complaint of abdominal pain, the following conditions were considered on the differential diagnosis: gastritis/PUD, enteritis/duodenitis, appendicitis, cholelithiasis/cholecystitis, cholangitis, pancreatitis, ruptured viscus, colitis, diverticulitis, small/large bowel obstruction, proctitis, cystitis, pyelonephritis, ureteral colic, aortic dissection, aortic aneurysm. Atypical chest etiologies were also considered including ACS, PE, and pneumonia.   Fortunately cardiac and aortic work-up were negative, patient however found to have gallstone.  White blood cell count was normal, however there was concerns of acute cholecystitis and gallbladder wall thickening on ultrasound.  Currently  pending current surgical consult.            Final Clinical Impression(s) / ED Diagnoses Final diagnoses:  Calculus of gallbladder with acute cholecystitis without obstruction  Left-sided chest pain  Primary hypertension    Rx / DC Orders ED Discharge Orders     None         Carlisle Cater, PA-C 08/26/22 [place], [place], DO 08/29/22 1453

## 2022-08-26 NOTE — ED Triage Notes (Signed)
BIB EMS for chest pain that started this am after eating.  Described as stabbing in left chest. +vomiting  BP 210/96 324gm ASA 1 nitro 18g LAC.  Denies pain radiation. No sob

## 2022-08-26 NOTE — ED Notes (Signed)
Pt came from triage with complains of left sided chest pain that started this AM after getting home from work. Works night shift as a Presenter, [employer]. Pt also reports throwing up at home when pain started. Arrived in ED triage and STEMI was activated by Dr.Messick which later on was cancelled as soon as pt was transferred to room 16. Pt is alert and oriented x 4. Reports smoking marijuana and denies other drug use. No change from pt previous EKG from 2 years ago. Initial bp was in the 200s. EMS gave 1 SL nitro with mild relief. Cardiac monitoring in place. Will continue to monitor.

## 2022-08-26 NOTE — ED Notes (Signed)
Dr.Kingsley is aware of pt bp of 180/134. Hold nitro at this time per MD order. Pt remains alert and oriented x 4. Cardiac monitoring in place.

## 2022-08-26 NOTE — H&P (Addendum)
[facility] Note  Cameron Greene 1985-11-21  [id number].    Requesting MD: Glyn Ade Chief Complaint/Reason for Consult: cholecystitis   HPI:  Cameron Greene is a 36 y.o. male PMH HTN, hypertrophic cardiomyopathy, OSA, obesity BMI 44 who presented to [facility] today complaining of acute onset left sided chest pain. Pain started early this morning after getting home from work. Pain associated with nausea and vomiting. He called EMS and was given nitroglycerin and aspirin in route without improvement. He denies similar pain in the past but does report upper abdominal discomfort and nausea after heavy meals in the last couple of months.  In the ED patient found to be very hypertensive with BP 205/113, otherwise VSS. BP has improved after norvasc and pain management. Cardiac work up negative with normal troponins and no ST elevation on EKG. EKG does show normal sinus rhythm, incomplete right bundle branch block, and left ventricular hypertrophy. CTA with no evidence of dissection in thoracic and abdominal aorta, no PE; concern for large gallbladder stone with wall thickening and possible minimal pericholecystic fluid. U/s also suspicious for cholecystitis with large gallstone impacted in the neck of the gallbladder. WBC 8.7, LFTs and lipase WNL, Creatinine 1.34. General surgery asked to see.  Abdominal surgical history: no abdominal surgery Anticoagulants: none Smokes 1/2 PPD Denies alcohol use Admits to cocaine use twice weekly, and marijuana use regularly Employment: works night shift as a Electrical engineer His fiance is at bedside  Allergic to fish and bees  ROS: Review of Systems  Gastrointestinal:  Positive for abdominal pain, constipation, nausea and vomiting.  All other systems reviewed and are negative.   All systems reviewed and otherwise negative except for as above  Family History  Problem Relation Age of Onset   Hypertension Mother    Asthma Mother      Past Medical History:  Diagnosis Date   Arthritis    "right knee" (02/02/2018)   Asthma    Gout    Hypertension     Past Surgical History:  Procedure Laterality Date   CARDIAC CATHETERIZATION  02/02/2018   KNEE ARTHROSCOPY Right ~ 2009   "shaved behind my kneecap"   LEFT HEART CATH AND CORONARY ANGIOGRAPHY N/A 02/02/2018   Procedure: LEFT HEART CATH AND CORONARY ANGIOGRAPHY;  Surgeon: Swazil[place], Peter M, MD;  Location: [facility];  Service: Cardiovascular;  Laterality: N/A;    Social History:  reports that he has been smoking cigarettes. He has a 2.30 pack-year smoking history. He has never used smokeless tobacco. He reports current alcohol use. He reports current drug use. Drug: Marijuana.  Allergies:  Allergies  Allergen Reactions   Bee Venom Anaphylaxis and Hives   Fish Allergy Anaphylaxis, Hives and Swelling    Throat swelling, can't breathe.   Apple Itching   Banana Itching    (Not in a hospital admission)   Prior to Admission medications   Medication Sig Start Date End Date Taking? Authorizing Provider  acetaminophen (TYLENOL) 500 MG tablet Take 1,500-2,000 mg by mouth daily as needed for moderate pain or headache.   Yes [provider]  amLODipine (NORVASC) 10 MG tablet Take 1 tablet (10 mg total) by mouth daily. Patient not taking: Reported on 08/26/2022 03/02/18   Jodelle Gross, NP    Blood pressure (!) 151/87, pulse (!) 53, temperature 97.8 F (36.6 C), temperature source Oral, resp. rate 18, height 6' (1.829 m), weight (!) 147.4 kg, SpO2 96 %. Physical Exam: General: pleasant,  WD/WN male who is laying in bed in NAD HEENT: head is normocephalic, atraumatic.  Sclera are noninjected.  Pupils equal and round.  Ears and nose without any masses or lesions.  Mouth is pink and moist. Dentition fair Heart: regular, rate, and rhythm.  Normal s1,s2. No obvious murmurs, gallops, or rubs noted.  Palpable radial and pedal pulses bilaterally; R ankle  swelling - states he twisted it recently Lungs: CTAB, no wheezes, rhonchi, or rales noted.  Respiratory effort nonlabored Abd: soft, nontender, +BS, no masses, hernias, or organomegaly MS: no BUE/BLE edema Skin: warm and dry with no masses, lesions, or rashes Psych: A&Ox4 with an appropriate affect Neuro: MAEs, no gross motor or sensory deficits BUE/BLE  Results for orders placed or performed during the hospital encounter of 08/26/22 (from the past 48 hour(s))  CBC     Status: Abnormal   Collection Time: 08/26/22 10:42 AM  Result Value Ref Range   WBC 8.7 4.0 - 10.5 K/uL   RBC 4.34 4.22 - 5.81 MIL/uL   Hemoglobin 13.4 13.0 - 17.0 g/dL   HCT 37.8 (L) 39.0 - 52.0 %   MCV 87.1 80.0 - 100.0 fL   MCH 30.9 26.0 - 34.0 pg   MCHC 35.4 30.0 - 36.0 g/dL   RDW 11.8 11.5 - 15.5 %   Platelets 192 150 - 400 K/uL   nRBC 0.0 0.0 - 0.2 %    Comment: Performed at [facility], [place] [address] [place], [place] [address]  Troponin I (High Sensitivity)     Status: None   Collection Time: 08/26/22 10:42 AM  Result Value Ref Range   Troponin I (High Sensitivity) 14 <18 ng/L    Comment: (NOTE) Elevated high sensitivity troponin I (hsTnI) values and significant  changes across serial measurements may suggest ACS but many other  chronic and acute conditions are known to elevate hsTnI results.  Refer to the "Links" section for chest pain algorithms and additional  guidance. Performed at [facility], [place] [address], [place], [place] [address]   Comprehensive metabolic panel     Status: Abnormal   Collection Time: 08/26/22 10:42 AM  Result Value Ref Range   Sodium 138 135 - 145 mmol/L   Potassium 3.6 3.5 - 5.1 mmol/L   Chloride 106 98 - 111 mmol/L   CO2 24 22 - 32 mmol/L   Glucose, Bld 111 (H) 70 - 99 mg/dL    Comment: Glucose reference range applies only to samples taken after fasting for at least 8 hours.   BUN 15 6 - 20 mg/dL   Creatinine, Ser 1.34 (H) 0.61 - 1.24 mg/dL   Calcium  9.1 8.9 - 10.3 mg/dL   Total Protein 6.9 6.5 - 8.1 g/dL   Albumin 4.0 3.5 - 5.0 g/dL   AST 27 15 - 41 U/L   ALT 44 0 - 44 U/L   Alkaline Phosphatase 125 38 - 126 U/L   Total Bilirubin 0.5 0.3 - 1.2 mg/dL   GFR, Estimated >60 >60 mL/min    Comment: (NOTE) Calculated using the CKD-EPI Creatinine Equation (2021)    Anion gap 8 5 - 15    Comment: Performed at [place] [address], [place], [place] [address]  Troponin I (High Sensitivity)     Status: None   Collection Time: 08/26/22  1:54 PM  Result Value Ref Range   Troponin I (High Sensitivity) 12 <18 ng/L    Comment: (NOTE) Elevated high  sensitivity troponin I (hsTnI) values and significant  changes across serial measurements may suggest ACS but many other  chronic and acute conditions are known to elevate hsTnI results.  Refer to the "Links" section for chest pain algorithms and additional  guidance. Performed at [facility], [address] [place], [place] [address]   Lipase, blood     Status: None   Collection Time: 08/26/22  1:54 PM  Result Value Ref Range   Lipase 41 11 - 51 U/L    Comment: Performed at [facility], [address], [place], [place] [address]   US Abdomen Limited RUQ (LIVER/GB)  Result Date: 08/26/2022 CLINICAL DATA:  Chest pain. EXAM: ULTRASOUND ABDOMEN LIMITED RIGHT UPPER QUADRANT COMPARISON:  CT of the chest, abdomen and pelvis, CTA which was acquired on August 26, 2022 as well. FINDINGS: Gallbladder: Large gallstone which is non mobile, likely impacted in the gallbladder neck measures up to 1.4 cm. Signs of gallbladder wall thickening perhaps as much as 5-6 mm. Gallbladder is only mildly distended. There is reported tenderness over the gallbladder during the evaluation. There is no pericholecystic fluid. Common bile duct: Diameter: 5 mm Liver: Markedly echogenic hepatic parenchyma limits assessment of deeper portions of the liver and other abdominal structures around the liver.  No visible lesion on submitted images. Portal vein is patent on color Doppler imaging with normal direction of blood flow towards the liver. Other: No ascites IMPRESSION: Findings that are suspicious for acute cholecystitis in the appropriate clinical setting. If physical exam or laboratory values are equivocal, HIDA scan could be utilized for further assessment to add specificity. Marked hepatic steatosis. Electronically Signed   By: Donzetta Kohut M.D.   On: 08/26/2022 14:58   CT Angio Chest/Abd/Pel for Dissection W and/or Wo Contrast  Result Date: 08/26/2022 CLINICAL DATA:  Chest pain, back pain EXAM: CT ANGIOGRAPHY CHEST, ABDOMEN AND PELVIS TECHNIQUE: Non-contrast CT of the chest was initially obtained. Multidetector CT imaging through the chest, abdomen and pelvis was performed using the standard protocol during bolus administration of intravenous contrast. Multiplanar reconstructed images and MIPs were obtained and reviewed to evaluate the vascular anatomy. RADIATION DOSE REDUCTION: This exam was performed according to the departmental dose-optimization program which includes automated exposure control, adjustment of the mA and/or kV according to patient size and/or use of iterative reconstruction technique. CONTRAST:  OMNIPAQUE IOHEXOL 350 MG/ML SOLN COMPARISON:  Chest radiograph done earlier today FINDINGS: CTA CHEST FINDINGS Cardiovascular: There is no demonstrable mural hematoma in noncontrast images off thoracic aorta. There is homogeneous enhancement in thoracic aorta. There is no demonstrable intimal flap. Major branches of thoracic aorta appear patent. There are no intraluminal filling defects in central pulmonary artery branches. Heart is enlarged in size. Mediastinum/Nodes: No significant lymphadenopathy is seen. Lungs/Pleura: There is no focal pulmonary consolidation. There is no pleural effusion or pneumothorax. Musculoskeletal: Unremarkable. Review of the MIP images confirms the above  findings. CTA ABDOMEN AND PELVIS FINDINGS VASCULAR Aorta: Appears normal Celiac: Appears normal SMA: Appears normal Renals: Appear normal IMA: Patent Inflow: Unremarkable. Veins: Unremarkable. Review of the MIP images confirms the above findings. NON-VASCULAR Hepatobiliary: There is marked fatty infiltration. There is no dilation of bile ducts. There is a large gallbladder stone. There is mild wall thickening in gallbladder. There is possible minimal amount of pericholecystic fluid. Pancreas: Unremarkable. Spleen: Unremarkable. Adrenals/Urinary Tract: Adrenals are unremarkable. There is no hydronephrosis. There are no renal or ureteral stones. Urinary bladder is distended. There is no  wall thickening in the bladder. Stomach/Bowel: Stomach is not distended. Small bowel loops are not dilated. Appendix is not dilated. There is no wall thickening in colon. There is no pericolic stranding. Lymphatic: No significant lymphadenopathy seen. Reproductive: Unremarkable. Other: There is no ascites or pneumoperitoneum. Musculoskeletal: Degenerative changes are noted in lumbar spine, more severe at L4-L5 and L5-S1 levels. There is encroachment of neural foramina by bony spurs at multiple levels in lumbar spine. Review of the MIP images confirms the above findings. IMPRESSION: There is no evidence of dissection in thoracic and abdominal aorta. There is no evidence of central pulmonary artery embolism. Heart is enlarged in size. There is no focal pulmonary consolidation. Large gallbladder stone. There is wall thickening and possible minimal pericholecystic fluid. Please correlate for possible acute cholecystitis and consider gallbladder sonogram. There is no evidence of intestinal obstruction or pneumoperitoneum. There is no hydronephrosis. Appendix is not dilated. Fatty liver. Lumbar spondylosis with encroachment of neural foramina at multiple levels. Other findings as described in the body of the report. Electronically Signed    By: Ernie Avena M.D.   On: 08/26/2022 13:19   DG Chest Port 1 View  Result Date: 08/26/2022 CLINICAL DATA:  Chest pain beginning today EXAM: PORTABLE CHEST 1 VIEW COMPARISON:  11/13/2018 FINDINGS: Cardiomegaly. Mediastinal shadows are normal. 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 No infiltrate, collapse or effusion. IMPRESSION: Cardiomegaly. 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 Electronically Signed   By: Paulina Fusi M.D.   On: 08/26/2022 11:11      Assessment/Plan Acute calculous cholecystitis  - atypical presentation with L chest and LUQ pain. He does have a history of biliary colic over the last couple of months that was not previously present and he has a large gallstone impacted in the neck of his gallbladder. ACS workup negative and I do think his symptoms are due to early acute cholecystitis. Recommend admission for cholecystectomy. The operative and non-operative treatment options were discussed with the patient and he agrees to proceed with surgery.  ID - Rocephin VTE - SCD's FEN - CLD, NPO after MN, IVF Foley - none  HTN - reorder home norvasc, PRN metoprolol and hydralazine Hypertrophic cardiomyopathy - ECHO 02/03/18 EF 55-60%; outpatient follow up with cardiology Right bundle branch block OSA Obesity BMI 44 Polysubstance use - encourage cessation  I reviewed ED provider notes, last 24 h vitals and pain scores, last 48 h intake and output, last 24 h labs and trends, and last 24 h imaging results   Hosie Spangle, [facility] Surgery 08/26/2022, 3:39 PM Please see Amion for pager number during day hours 7:00am-4:30pm

## 2022-08-27 ENCOUNTER — Encounter (HOSPITAL_COMMUNITY): Payer: Self-pay

## 2022-08-27 ENCOUNTER — Observation Stay (HOSPITAL_COMMUNITY): Payer: Self-pay | Admitting: Certified Registered"

## 2022-08-27 ENCOUNTER — Other Ambulatory Visit: Payer: Self-pay

## 2022-08-27 ENCOUNTER — Encounter (HOSPITAL_COMMUNITY): Admission: EM | Disposition: A | Discharge status: Home / Self Care | Payer: Self-pay | Source: Home / Self Care

## 2022-08-27 DIAGNOSIS — F17210 Nicotine dependence, cigarettes, uncomplicated: Secondary | ICD-10-CM

## 2022-08-27 DIAGNOSIS — I10 Essential (primary) hypertension: Secondary | ICD-10-CM

## 2022-08-27 DIAGNOSIS — Z6841 Body Mass Index (BMI) 40.0 and over, adult: Secondary | ICD-10-CM

## 2022-08-27 DIAGNOSIS — Z9049 Acquired absence of other specified parts of digestive tract: Secondary | ICD-10-CM

## 2022-08-27 DIAGNOSIS — K8012 Calculus of gallbladder with acute and chronic cholecystitis without obstruction: Secondary | ICD-10-CM

## 2022-08-27 HISTORY — PX: CHOLECYSTECTOMY: SHX55

## 2022-08-27 LAB — CBC
HCT: 39 % (ref 39.0–52.0)
Hemoglobin: 14.3 g/dL (ref 13.0–17.0)
MCH: 31.2 pg (ref 26.0–34.0)
MCHC: 36.7 g/dL — ABNORMAL HIGH (ref 30.0–36.0)
MCV: 85.2 fL (ref 80.0–100.0)
Platelets: 178 10*3/uL (ref 150–400)
RBC: 4.58 MIL/uL (ref 4.22–5.81)
RDW: 11.9 % (ref 11.5–15.5)
WBC: 7.7 10*3/uL (ref 4.0–10.5)
nRBC: 0 % (ref 0.0–0.2)

## 2022-08-27 LAB — COMPREHENSIVE METABOLIC PANEL
ALT: 48 U/L — ABNORMAL HIGH (ref 0–44)
AST: 27 U/L (ref 15–41)
Albumin: 4 g/dL (ref 3.5–5.0)
Alkaline Phosphatase: 122 U/L (ref 38–126)
Anion gap: 11 (ref 5–15)
BUN: 10 mg/dL (ref 6–20)
CO2: 23 mmol/L (ref 22–32)
Calcium: 9.5 mg/dL (ref 8.9–10.3)
Chloride: 106 mmol/L (ref 98–111)
Creatinine, Ser: 1.14 mg/dL (ref 0.61–1.24)
GFR, Estimated: >60 mL/min (ref >60)
Glucose, Bld: 98 mg/dL (ref 70–99)
Potassium: 3.5 mmol/L (ref 3.5–5.1)
Sodium: 140 mmol/L (ref 135–145)
Total Bilirubin: 0.7 mg/dL (ref 0.3–1.2)
Total Protein: 7.2 g/dL (ref 6.5–8.1)

## 2022-08-27 LAB — HIV ANTIBODY (ROUTINE TESTING W REFLEX): HIV Screen 4th Generation wRfx: NONREACTIVE

## 2022-08-27 SURGERY — LAPAROSCOPIC CHOLECYSTECTOMY
Anesthesia: General | Site: Abdomen

## 2022-08-27 MED ORDER — DEXMEDETOMIDINE HCL IN NACL 80 MCG/20ML IV SOLN
INTRAVENOUS | Status: DC | PRN
  Administered 2022-08-27: 4 ug via BUCCAL
  Administered 2022-08-27: 8 ug via BUCCAL
  Administered 2022-08-27: 4 ug via BUCCAL

## 2022-08-27 MED ORDER — PROPOFOL 10 MG/ML IV BOLUS
INTRAVENOUS | Status: DC | PRN
  Administered 2022-08-27: 100 mg via INTRAVENOUS

## 2022-08-27 MED ORDER — OXYCODONE HCL 5 MG PO TABS: 5.0000 mg | ORAL_TABLET | Freq: Four times a day (QID) | ORAL | 0 refills | Status: DC | PRN

## 2022-08-27 MED ORDER — MIDAZOLAM HCL 5 MG/5ML IJ SOLN
INTRAMUSCULAR | Status: DC | PRN
  Administered 2022-08-27: 2 mg via INTRAVENOUS

## 2022-08-27 MED ORDER — HYDROMORPHONE HCL 1 MG/ML IJ SOLN
INTRAMUSCULAR | Status: DC | PRN
  Administered 2022-08-27: .5 mg via INTRAVENOUS

## 2022-08-27 MED ORDER — DEXMEDETOMIDINE HCL IN NACL 80 MCG/20ML IV SOLN
INTRAVENOUS | Status: AC
  Filled 2022-08-27: qty 20

## 2022-08-27 MED ORDER — LABETALOL HCL 5 MG/ML IV SOLN
INTRAVENOUS | Status: AC
  Filled 2022-08-27: qty 4

## 2022-08-27 MED ORDER — PROPOFOL 10 MG/ML IV BOLUS
INTRAVENOUS | Status: AC
  Filled 2022-08-27: qty 20

## 2022-08-27 MED ORDER — DEXAMETHASONE SODIUM PHOSPHATE 10 MG/ML IJ SOLN
INTRAMUSCULAR | Status: DC | PRN
  Administered 2022-08-27: 10 mg via INTRAVENOUS

## 2022-08-27 MED ORDER — CHLORHEXIDINE GLUCONATE 0.12 % MT SOLN
OROMUCOSAL | Status: AC
  Administered 2022-08-27: 15 mL via OROMUCOSAL
  Filled 2022-08-27: qty 15

## 2022-08-27 MED ORDER — FENTANYL CITRATE (PF) 250 MCG/5ML IJ SOLN
INTRAMUSCULAR | Status: AC
  Filled 2022-08-27: qty 5

## 2022-08-27 MED ORDER — MIDAZOLAM HCL 2 MG/2ML IJ SOLN
INTRAMUSCULAR | Status: AC
  Filled 2022-08-27: qty 2

## 2022-08-27 MED ORDER — OXYCODONE HCL 5 MG PO TABS: 5.0000 mg | ORAL_TABLET | Freq: Once | ORAL | Status: DC | PRN

## 2022-08-27 MED ORDER — LACTATED RINGERS IV SOLN: INTRAVENOUS | Status: DC

## 2022-08-27 MED ORDER — BUPIVACAINE-EPINEPHRINE (PF) 0.25% -1:200000 IJ SOLN
INTRAMUSCULAR | Status: AC
  Filled 2022-08-27: qty 30

## 2022-08-27 MED ORDER — BUPIVACAINE-EPINEPHRINE 0.25% -1:200000 IJ SOLN
INTRAMUSCULAR | Status: DC | PRN
  Administered 2022-08-27: 20 mL

## 2022-08-27 MED ORDER — SODIUM CHLORIDE 0.9 % IR SOLN
Status: DC | PRN
  Administered 2022-08-27: 1000 mL

## 2022-08-27 MED ORDER — 0.9 % SODIUM CHLORIDE (POUR BTL) OPTIME
TOPICAL | Status: DC | PRN
  Administered 2022-08-27: 1000 mL

## 2022-08-27 MED ORDER — SUGAMMADEX SODIUM 200 MG/2ML IV SOLN
INTRAVENOUS | Status: DC | PRN
  Administered 2022-08-27: 300 mg via INTRAVENOUS

## 2022-08-27 MED ORDER — AMISULPRIDE (ANTIEMETIC) 5 MG/2ML IV SOLN: 10.0000 mg | Freq: Once | INTRAVENOUS | Status: DC | PRN

## 2022-08-27 MED ORDER — ROCURONIUM BROMIDE 100 MG/10ML IV SOLN
INTRAVENOUS | Status: DC | PRN
  Administered 2022-08-27: 100 mg via INTRAVENOUS

## 2022-08-27 MED ORDER — ORAL CARE MOUTH RINSE: 15.0000 mL | Freq: Once | OROMUCOSAL | Status: AC

## 2022-08-27 MED ORDER — ONDANSETRON HCL 4 MG/2ML IJ SOLN
INTRAMUSCULAR | Status: DC | PRN
  Administered 2022-08-27: 4 mg via INTRAVENOUS

## 2022-08-27 MED ORDER — HYDROMORPHONE HCL 1 MG/ML IJ SOLN: 0.2500 mg | INTRAMUSCULAR | Status: DC | PRN

## 2022-08-27 MED ORDER — PROMETHAZINE HCL 25 MG/ML IJ SOLN: 6.2500 mg | INTRAMUSCULAR | Status: DC | PRN

## 2022-08-27 MED ORDER — HYDROMORPHONE HCL 1 MG/ML IJ SOLN
INTRAMUSCULAR | Status: AC
  Filled 2022-08-27: qty 0.5

## 2022-08-27 MED ORDER — LACTATED RINGERS IV SOLN: INTRAVENOUS | Status: DC | PRN

## 2022-08-27 MED ORDER — LABETALOL HCL 5 MG/ML IV SOLN
INTRAVENOUS | Status: DC | PRN
  Administered 2022-08-27: 5 mg via INTRAVENOUS

## 2022-08-27 MED ORDER — KETOROLAC TROMETHAMINE 30 MG/ML IJ SOLN
INTRAMUSCULAR | Status: DC | PRN
  Administered 2022-08-27: 30 mg via INTRAVENOUS

## 2022-08-27 MED ORDER — FENTANYL CITRATE (PF) 100 MCG/2ML IJ SOLN
INTRAMUSCULAR | Status: DC | PRN
  Administered 2022-08-27 (×5): 50 ug via INTRAVENOUS

## 2022-08-27 MED ORDER — CHLORHEXIDINE GLUCONATE 0.12 % MT SOLN: 15.0000 mL | Freq: Once | OROMUCOSAL | Status: AC

## 2022-08-27 MED ORDER — AMLODIPINE BESYLATE 10 MG PO TABS: 10.0000 mg | ORAL_TABLET | Freq: Every day | ORAL | 1 refills | Status: DC

## 2022-08-27 MED ORDER — MEPERIDINE HCL 25 MG/ML IJ SOLN: 6.2500 mg | INTRAMUSCULAR | Status: DC | PRN

## 2022-08-27 MED ORDER — OXYCODONE HCL 5 MG/5ML PO SOLN: 5.0000 mg | Freq: Once | ORAL | Status: DC | PRN

## 2022-08-27 MED ORDER — LIDOCAINE 2% (20 MG/ML) 5 ML SYRINGE
INTRAMUSCULAR | Status: DC | PRN
  Administered 2022-08-27: 100 mg via INTRAVENOUS

## 2022-08-27 SURGICAL SUPPLY — 34 items
APPLIER CLIP 5 13 M/L LIGAMAX5 (MISCELLANEOUS) ×2
BAG COUNTER SPONGE SURGICOUNT (BAG) ×1 IMPLANT
CANISTER SUCT 3000ML PPV (MISCELLANEOUS) ×1 IMPLANT
CHLORAPREP W/TINT 26 (MISCELLANEOUS) ×1 IMPLANT
CLIP APPLIE 5 13 M/L LIGAMAX5 (MISCELLANEOUS) ×1 IMPLANT
COVER SURGICAL LIGHT HANDLE (MISCELLANEOUS) ×1 IMPLANT
DERMABOND ADVANCED .7 DNX12 (GAUZE/BANDAGES/DRESSINGS) ×1 IMPLANT
ELECT REM PT RETURN 9FT ADLT (ELECTROSURGICAL) ×1
ELECTRODE REM PT RTRN 9FT ADLT (ELECTROSURGICAL) ×1 IMPLANT
GLOVE BIO SURGEON STRL SZ7.5 (GLOVE) IMPLANT
GLOVE SURG SIGNA 7.5 PF LTX (GLOVE) ×1 IMPLANT
GOWN STRL REUS W/ TWL LRG LVL3 (GOWN DISPOSABLE) ×2 IMPLANT
GOWN STRL REUS W/ TWL XL LVL3 (GOWN DISPOSABLE) ×1 IMPLANT
GOWN STRL REUS W/TWL LRG LVL3 (GOWN DISPOSABLE) ×2
GOWN STRL REUS W/TWL XL LVL3 (GOWN DISPOSABLE) ×1
KIT BASIN OR (CUSTOM PROCEDURE TRAY) ×1 IMPLANT
KIT TURNOVER KIT B (KITS) ×1 IMPLANT
NS IRRIG 1000ML POUR BTL (IV SOLUTION) ×1 IMPLANT
PAD ARMBOARD 7.5X6 YLW CONV (MISCELLANEOUS) ×1 IMPLANT
POUCH SPECIMEN RETRIEVAL 10MM (ENDOMECHANICALS) ×1 IMPLANT
SCISSORS LAP 5X35 DISP (ENDOMECHANICALS) ×1 IMPLANT
SET IRRIG TUBING LAPAROSCOPIC (IRRIGATION / IRRIGATOR) ×1 IMPLANT
SET TUBE SMOKE EVAC HIGH FLOW (TUBING) ×1 IMPLANT
SLEEVE ENDOPATH XCEL 5M (ENDOMECHANICALS) ×2 IMPLANT
SPECIMEN JAR SMALL (MISCELLANEOUS) ×1 IMPLANT
SUT MNCRL AB 4-0 PS2 18 (SUTURE) ×1 IMPLANT
SYS BAG RETRIEVAL 10MM (BASKET) ×1
SYSTEM BAG RETRIEVAL 10MM (BASKET) IMPLANT
TOWEL GREEN STERILE (TOWEL DISPOSABLE) ×1 IMPLANT
TOWEL GREEN STERILE FF (TOWEL DISPOSABLE) ×1 IMPLANT
TRAY LAPAROSCOPIC MC (CUSTOM PROCEDURE TRAY) ×1 IMPLANT
TROCAR XCEL BLUNT TIP 100MML (ENDOMECHANICALS) ×1 IMPLANT
TROCAR Z-THREAD OPTICAL 5X100M (TROCAR) ×1 IMPLANT
WATER STERILE IRR 1000ML POUR (IV SOLUTION) ×1 IMPLANT

## 2022-08-27 NOTE — Discharge Instructions (Signed)
[facility]  Please arrive at least 30 min before your appointment to complete your check in paperwork.  If you are unable to arrive 30 min prior to your appointment time we may have to cancel or reschedule you. LAPAROSCOPIC SURGERY: POST OP INSTRUCTIONS Always review your discharge instruction sheet given to you by the facility where your surgery was performed. IF YOU HAVE DISABILITY OR FAMILY LEAVE FORMS, YOU MUST BRING THEM TO THE OFFICE FOR PROCESSING.   DO NOT GIVE THEM TO YOUR DOCTOR.  PAIN CONTROL  First take acetaminophen (Tylenol) AND/or ibuprofen (Advil) to control your pain after surgery.  Follow directions on package.  Taking acetaminophen (Tylenol) and/or ibuprofen (Advil) regularly after surgery will help to control your pain and lower the amount of prescription pain medication you may need.  You should not take more than 4,000 mg (4 grams) of acetaminophen (Tylenol) in 24 hours.  You should not take ibuprofen (Advil), aleve, motrin, naprosyn or other NSAIDS if you have a history of stomach ulcers or chronic kidney disease.  A prescription for pain medication may be given to you upon discharge.  Take your pain medication as prescribed, if you still have uncontrolled pain after taking acetaminophen (Tylenol) or ibuprofen (Advil). Use ice packs to help control pain. If you need a refill on your pain medication, please contact your pharmacy.  They will contact our office to request authorization. Prescriptions will not be filled after 5pm or on week-ends.  HOME MEDICATIONS Take your usually prescribed medications unless otherwise directed.  DIET You should follow a light diet the first few days after arrival home.  Be sure to include lots of fluids daily. Avoid fatty, fried foods.   CONSTIPATION It is common to experience some constipation after surgery and if you are taking pain medication.  Increasing fluid intake and taking a stool softener (such as Colace)  will usually help or prevent this problem from occurring.  A mild laxative (Milk of Magnesia or Miralax) should be taken according to package instructions if there are no bowel movements after 48 hours.  WOUND/INCISION CARE Most patients will experience some swelling and bruising in the area of the incisions.  Ice packs will help.  Swelling and bruising can take several days to resolve.  Unless discharge instructions indicate otherwise, follow guidelines below  STERI-STRIPS - you may remove your outer bandages 48 hours after surgery, and you may shower at that time.  You have steri-strips (small skin tapes) in place directly over the incision.  These strips should be left on the skin for 7-10 days.   DERMABOND/SKIN GLUE - you may shower in 24 hours.  The glue will flake off over the next 2-3 weeks. Any sutures or staples will be removed at the office during your follow-up visit.  ACTIVITIES You may resume regular (light) daily activities beginning the next day--such as daily self-care, walking, climbing stairs--gradually increasing activities as tolerated.  You may have sexual intercourse when it is comfortable.  Refrain from any heavy lifting or straining until approved by your doctor. You may drive when you are no longer taking prescription pain medication, you can comfortably wear a seatbelt, and you can safely maneuver your car and apply brakes.  FOLLOW-UP You should see your doctor in the office for a follow-up appointment approximately 2-3 weeks after your surgery.  You should have been given your post-op/follow-up appointment when your surgery was scheduled.  If you did not receive a post-op/follow-up appointment, make sure   that you call for this appointment within a day or two after you arrive home to insure a convenient appointment time.  OTHER INSTRUCTIONS  WHEN TO CALL YOUR DOCTOR: Fever over 101.0 Inability to urinate Continued bleeding from incision. Increased pain, redness, or  drainage from the incision. Increasing abdominal pain  The clinic staff is available to answer your questions during regular business hours.  Please don't hesitate to call and ask to speak to one of the nurses for clinical concerns.  If you have a medical emergency, go to the nearest emergency room or call 911.  A surgeon from [facility] is always on call at the hospital. [address], [address], [place], [place]  [address] ? [address] [address], [place], [place]   [address] [phone] ? [phone] ? FAX [phone]   

## 2022-08-27 NOTE — Anesthesia Postprocedure Evaluation (Signed)
Anesthesia Post Note  Patient: Cameron Greene  Procedure(s) Performed: LAPAROSCOPIC CHOLECYSTECTOMY (Abdomen)     Patient location during evaluation: PACU Anesthesia Type: General Level of consciousness: awake and alert Pain management: pain level controlled Vital Signs Assessment: post-procedure vital signs reviewed and stable Respiratory status: spontaneous breathing, nonlabored ventilation and respiratory function stable Cardiovascular status: blood pressure returned to baseline and stable Postop Assessment: no apparent nausea or vomiting Anesthetic complications: no   No notable events documented.  Last Vitals:  Vitals:   08/27/22 1337 08/27/22 1400  BP: (!) 159/81 (!) 149/85  Pulse: 71 75  Resp: 15 14  Temp: 36.8 C 36.9 C  SpO2: 93% 95%    Last Pain:  Vitals:   08/27/22 1252  TempSrc:   PainSc: Asleep                 Lynda Rainwater

## 2022-08-27 NOTE — ED Notes (Signed)
Paged md for pt bp. PRN meds given and sbp >180. Awaiting callback

## 2022-08-27 NOTE — Op Note (Signed)
Laparoscopic Cholecystectomy Procedure Note  Indications: This patient presents with symptomatic gallbladder disease and will undergo laparoscopic cholecystectomy.  Pre-operative Diagnosis: cholecystitis with cholelithiasis  Post-operative Diagnosis: Same  Surgeon: Coralie Keens   Assistants: none  Anesthesia: General endotracheal anesthesia  ASA Class: 2  Procedure Details  The patient was seen again in the Holding Room. The risks, benefits, complications, treatment options, and expected outcomes were discussed with the patient. The possibilities of reaction to medication, pulmonary aspiration, perforation of viscus, bleeding, recurrent infection, finding a normal gallbladder, the need for additional procedures, failure to diagnose a condition, the possible need to convert to an open procedure, and creating a complication requiring transfusion or operation were discussed with the patient. The likelihood of improving the patient's symptoms with return to their baseline status is good.  The patient and/or family concurred with the proposed plan, giving informed consent. The site of surgery properly noted. The patient was taken to Operating Room, identified as MICIAH COVELLI and the procedure verified as Laparoscopic Cholecystectomy with Intraoperative Cholangiogram. A Time Out was held and the above information confirmed.  Prior to the induction of general anesthesia, antibiotic prophylaxis was administered. General endotracheal anesthesia was then administered and tolerated well. After the induction, the abdomen was prepped with Chloraprep and draped in sterile fashion. The patient was positioned in the supine position.  Local anesthetic agent was injected into the skin near the umbilicus and an incision made. We dissected down to the abdominal fascia with blunt dissection.  The fascia was incised vertically and we entered the peritoneal cavity bluntly.  A pursestring suture of 0-Vicryl was  placed around the fascial opening.  The Hasson cannula was inserted and secured with the stay suture.  Pneumoperitoneum was then created with CO2 and tolerated well without any adverse changes in the patient's vital signs. A 5-mm port was placed in the subxiphoid position.  Two 5-mm ports were placed in the right upper quadrant. All skin incisions were infiltrated with a local anesthetic agent before making the incision and placing the trocars.   We positioned the patient in reverse Trendelenburg, tilted slightly to the patient's left.  The gallbladder was identified, the fundus grasped and retracted cephalad. It appeared consistent with cholecystitis.  Adhesions were lysed bluntly and with the electrocautery where indicated, taking care not to injure any adjacent organs or viscus. The infundibulum was grasped and retracted laterally, exposing the peritoneum overlying the triangle of Calot. This was then divided and exposed in a blunt fashion. The cystic duct was clearly identified and bluntly dissected circumferentially. A critical view of the cystic duct and cystic artery was obtained.  The cystic duct was then ligated with clips and divided. The cystic artery was, dissected free, ligated with clips and divided as well.   The gallbladder was dissected from the liver bed in retrograde fashion with the electrocautery. The gallbladder was removed and placed in an Endocatch sac. The liver bed was irrigated and inspected. Hemostasis was achieved with the electrocautery. Copious irrigation was utilized and was repeatedly aspirated until clear.  The gallbladder and Endocatch sac were then removed through the umbilical port site.  The pursestring suture was used to close the umbilical fascia.    We again inspected the right upper quadrant for hemostasis.  Pneumoperitoneum was released as we removed the trocars.  4-0 Monocryl was used to close the skin.  Skin glue was then applied. The patient was then extubated and  brought to the recovery room in stable condition.  Instrument, sponge, and needle counts were correct at closure and at the conclusion of the case.   Findings: Acute and chronic Cholecystitis with Cholelithiasis  Estimated Blood Loss: Minimal         Drains: none         Specimens: Gallbladder           Complications: None; patient tolerated the procedure well.         Disposition: PACU - hemodynamically stable.         Condition: stable

## 2022-08-27 NOTE — Anesthesia Preprocedure Evaluation (Signed)
Anesthesia Evaluation  Patient identified by MRN, date of birth, ID band Patient awake    Reviewed: Allergy & Precautions, NPO status , Patient's Chart, lab work & pertinent test results  Airway Mallampati: II  TM Distance: >3 FB Neck ROM: Full    Dental no notable dental hx.    Pulmonary asthma , Current Smoker and Patient abstained from smoking.,    Pulmonary exam normal breath sounds clear to auscultation       Cardiovascular hypertension, Pt. on medications negative cardio ROS Normal cardiovascular exam Rhythm:Regular Rate:Normal     Neuro/Psych negative neurological ROS  negative psych ROS   GI/Hepatic negative GI ROS, Neg liver ROS,   Endo/Other  Morbid obesity  Renal/GU negative Renal ROS  negative genitourinary   Musculoskeletal  (+) Arthritis ,   Abdominal (+) + obese,   Peds negative pediatric ROS (+)  Hematology negative hematology ROS (+)   Anesthesia Other Findings   Reproductive/Obstetrics negative OB ROS                             Anesthesia Physical Anesthesia Plan  ASA: 3  Anesthesia Plan: General   Post-op Pain Management: Ofirmev IV (intra-op)*   Induction: Intravenous  PONV Risk Score and Plan: 1 and Ondansetron and Treatment may vary due to age or medical condition  Airway Management Planned: Oral ETT  Additional Equipment:   Intra-op Plan:   Post-operative Plan: Extubation in OR  Informed Consent: I have reviewed the patients History and Physical, chart, labs and discussed the procedure including the risks, benefits and alternatives for the proposed anesthesia with the patient or authorized representative who has indicated his/her understanding and acceptance.     Dental advisory given  Plan Discussed with: CRNA  Anesthesia Plan Comments:         Anesthesia Quick Evaluation

## 2022-08-27 NOTE — Transfer of Care (Signed)
Immediate Anesthesia Transfer of Care Note  Patient: Cameron Greene  Procedure(s) Performed: LAPAROSCOPIC CHOLECYSTECTOMY (Abdomen)  Patient Location: PACU  Anesthesia Type:General  Level of Consciousness: drowsy and patient cooperative  Airway & Oxygen Therapy: Patient Spontanous Breathing  Post-op Assessment: Report given to RN and Post -op Vital signs reviewed and stable  Post vital signs: Reviewed and stable  Last Vitals:  Vitals Value Taken Time  BP 157/80 08/27/22 1252  Temp 36.7 C 08/27/22 1252  Pulse 74 08/27/22 1300  Resp 20 08/27/22 1300  SpO2 90 % 08/27/22 1300  Vitals shown include unvalidated device data.  Last Pain:  Vitals:   08/27/22 1252  TempSrc:   PainSc: Asleep         Complications: No notable events documented.

## 2022-08-27 NOTE — Discharge Summary (Signed)
[facility] Discharge Summary   Patient ID: Cameron Greene MRN: [id number] DOB/AGE: November 11, 1985 36 y.o.  Admit date: 08/26/2022 Discharge date: 08/27/2022  Admitting Diagnosis: Acute cholecystitis   Discharge Diagnosis Patient Active Problem List   Diagnosis Date Noted   S/P cholecystectomy 08/27/2022   Acute cholecystitis 08/26/2022   Precordial chest pain 02/02/2018   HTN (hypertension) 02/02/2018    Consultants None  Imaging: US Abdomen Limited RUQ (LIVER/GB)  Result Date: 08/26/2022 CLINICAL DATA:  Chest pain. EXAM: ULTRASOUND ABDOMEN LIMITED RIGHT UPPER QUADRANT COMPARISON:  CT of the chest, abdomen and pelvis, CTA which was acquired on August 26, 2022 as well. FINDINGS: Gallbladder: Large gallstone which is non mobile, likely impacted in the gallbladder neck measures up to 1.4 cm. Signs of gallbladder wall thickening perhaps as much as 5-6 mm. Gallbladder is only mildly distended. There is reported tenderness over the gallbladder during the evaluation. There is no pericholecystic fluid. Common bile duct: Diameter: 5 mm Liver: Markedly echogenic hepatic parenchyma limits assessment of deeper portions of the liver and other abdominal structures around the liver. No visible lesion on submitted images. Portal vein is patent on color Doppler imaging with normal direction of blood flow towards the liver. Other: No ascites IMPRESSION: Findings that are suspicious for acute cholecystitis in the appropriate clinical setting. If physical exam or laboratory values are equivocal, HIDA scan could be utilized for further assessment to add specificity. Marked hepatic steatosis. Electronically Signed   By: Donzetta Kohut M.D.   On: 08/26/2022 14:58   CT Angio Chest/Abd/Pel for Dissection W and/or Wo Contrast  Result Date: 08/26/2022 CLINICAL DATA:  Chest pain, back pain EXAM: CT ANGIOGRAPHY CHEST, ABDOMEN AND PELVIS TECHNIQUE: Non-contrast CT of the chest was initially  obtained. Multidetector CT imaging through the chest, abdomen and pelvis was performed using the standard protocol during bolus administration of intravenous contrast. Multiplanar reconstructed images and MIPs were obtained and reviewed to evaluate the vascular anatomy. RADIATION DOSE REDUCTION: This exam was performed according to the departmental dose-optimization program which includes automated exposure control, adjustment of the mA and/or kV according to patient size and/or use of iterative reconstruction technique. CONTRAST:  OMNIPAQUE IOHEXOL 350 MG/ML SOLN COMPARISON:  Chest radiograph done earlier today FINDINGS: CTA CHEST FINDINGS Cardiovascular: There is no demonstrable mural hematoma in noncontrast images off thoracic aorta. There is homogeneous enhancement in thoracic aorta. There is no demonstrable intimal flap. Major branches of thoracic aorta appear patent. There are no intraluminal filling defects in central pulmonary artery branches. Heart is enlarged in size. Mediastinum/Nodes: No significant lymphadenopathy is seen. Lungs/Pleura: There is no focal pulmonary consolidation. There is no pleural effusion or pneumothorax. Musculoskeletal: Unremarkable. Review of the MIP images confirms the above findings. CTA ABDOMEN AND PELVIS FINDINGS VASCULAR Aorta: Appears normal Celiac: Appears normal SMA: Appears normal Renals: Appear normal IMA: Patent Inflow: Unremarkable. Veins: Unremarkable. Review of the MIP images confirms the above findings. NON-VASCULAR Hepatobiliary: There is marked fatty infiltration. There is no dilation of bile ducts. There is a large gallbladder stone. There is mild wall thickening in gallbladder. There is possible minimal amount of pericholecystic fluid. Pancreas: Unremarkable. Spleen: Unremarkable. Adrenals/Urinary Tract: Adrenals are unremarkable. There is no hydronephrosis. There are no renal or ureteral stones. Urinary bladder is distended. There is no wall thickening in  the bladder. Stomach/Bowel: Stomach is not distended. Small bowel loops are not dilated. Appendix is not dilated. There is no wall thickening in colon. There is no pericolic stranding. Lymphatic: No significant  lymphadenopathy seen. Reproductive: Unremarkable. Other: There is no ascites or pneumoperitoneum. Musculoskeletal: Degenerative changes are noted in lumbar spine, more severe at L4-L5 and L5-S1 levels. There is encroachment of neural foramina by bony spurs at multiple levels in lumbar spine. Review of the MIP images confirms the above findings. IMPRESSION: There is no evidence of dissection in thoracic and abdominal aorta. There is no evidence of central pulmonary artery embolism. Heart is enlarged in size. There is no focal pulmonary consolidation. Large gallbladder stone. There is wall thickening and possible minimal pericholecystic fluid. Please correlate for possible acute cholecystitis and consider gallbladder sonogram. There is no evidence of intestinal obstruction or pneumoperitoneum. There is no hydronephrosis. Appendix is not dilated. Fatty liver. Lumbar spondylosis with encroachment of neural foramina at multiple levels. Other findings as described in the body of the report. Electronically Signed   By: Elmer Picker M.D.   On: 08/26/2022 13:19   DG Chest Port 1 View  Result Date: 08/26/2022 CLINICAL DATA:  Chest pain beginning today EXAM: PORTABLE CHEST 1 VIEW COMPARISON:  11/13/2018 FINDINGS: Cardiomegaly. Mediastinal shadows are normal. 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 No infiltrate, collapse or effusion. IMPRESSION: Cardiomegaly. 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 Electronically Signed   By: Nelson Chimes M.D.   On: 08/26/2022 11:11    Procedures Dr. Ninfa Linden (08/27/2022) - Laparoscopic Cholecystectomy  Hospital Course:  Cameron Greene is a 36 y.o. male who presented to [facility] 10/18 with acute onset abdominal pain.  Workup showed acute  cholecystitis.  Patient was admitted and underwent procedure listed above.  Tolerated procedure well and was transferred to the floor.  Diet was advanced as tolerated.  On POD0, the patient was voiding well, tolerating diet, ambulating well, pain well controlled, vital signs stable, incisions c/d/i and felt stable for discharge home.  Patient will follow up as below and knows to call with questions or concerns.      Allergies as of 08/27/2022       Reactions   Bee Venom Anaphylaxis, Hives   Fish Allergy Anaphylaxis, Hives, Swelling   Throat swelling, can't breathe.   Apple Itching   Banana Itching        Medication List     TAKE these medications    acetaminophen 500 MG tablet Commonly known as: TYLENOL Take 1,500-2,000 mg by mouth daily as needed for moderate pain or headache.   amLODipine 10 MG tablet Commonly known as: NORVASC Take 1 tablet (10 mg total) by mouth daily.   oxyCODONE 5 MG immediate release tablet Commonly known as: Roxicodone Take 1 tablet (5 mg total) by mouth every 6 (six) hours as needed for severe pain.          Follow-up [facility], [place]. Go to.   Specialty: General Surgery Why: Your appointment is 09/17/22 at 1:45 pm Arrive 75min early to check in, fill out paperwork, Bring photo ID and insurance information Contact information: [address] [place] [phone]        Lendon Colonel, NP. Call.   Specialties: Nurse Practitioner, Radiology, Cardiology Why: call for follow up regarding your elevated blood pressure Contact information: [place] [address] [phone]                  Signed: Wellington Hampshire, [facility] 08/27/2022, 2:24 PM Please see Amion for pager number during day hours 7:00am-4:30pm

## 2022-08-27 NOTE — Progress Notes (Signed)
Patient ID: Cameron Greene, male   DOB: [birth year], 36 y.o.   MRN: [id number] Pre Procedure note for inpatients:   Cameron Greene has been scheduled for Procedure(s): LAPAROSCOPIC CHOLECYSTECTOMY (N/A) today. The various methods of treatment have been discussed with the patient. After consideration of the risks, benefits and treatment options the patient has consented to the planned procedure.   The patient has been seen and labs reviewed. There are no changes in the patient's condition to prevent proceeding with the planned procedure today.  Recent labs:  Lab Results  Component Value Date   WBC 7.7 08/27/2022   HGB 14.3 08/27/2022   HCT 39.0 08/27/2022   PLT 178 08/27/2022   GLUCOSE 98 08/27/2022   CHOL 123 02/02/2018   TRIG 83 02/02/2018   HDL 33 (L) 02/02/2018   LDLCALC 73 02/02/2018   ALT 48 (H) 08/27/2022   AST 27 08/27/2022   NA 140 08/27/2022   K 3.5 08/27/2022   CL 106 08/27/2022   CREATININE 1.14 08/27/2022   BUN 10 08/27/2022   CO2 23 08/27/2022   INR 0.98 02/02/2018   HGBA1C 4.1 (L) 02/02/2018    Coralie Keens, MD 08/27/2022 10:04 AM

## 2022-08-27 NOTE — Anesthesia Procedure Notes (Signed)
Procedure Name: Intubation Date/Time: 08/27/2022 11:55 AM  Performed by: Gwyndolyn Saxon, CRNAPre-anesthesia Checklist: Patient identified, Emergency Drugs available, Suction available and Patient being monitored Patient Re-evaluated:Patient Re-evaluated prior to induction Oxygen Delivery Method: Circle System Utilized Preoxygenation: Pre-oxygenation with 100% oxygen Induction Type: IV induction Ventilation: Mask ventilation without difficulty and Oral airway inserted - appropriate to patient size Laryngoscope Size: 4 and Mac Grade View: Grade I Tube type: Oral Number of attempts: 1 Airway Equipment and Method: Stylet and Oral airway Placement Confirmation: ETT inserted through vocal cords under direct vision, positive ETCO2 and breath sounds checked- equal and bilateral Secured at: 23 cm Tube secured with: Tape Dental Injury: Teeth and Oropharynx as per pre-operative assessment

## 2022-08-28 ENCOUNTER — Encounter (HOSPITAL_COMMUNITY): Payer: Self-pay | Admitting: Surgery

## 2022-08-28 LAB — SURGICAL PATHOLOGY

## 2024-05-28 ENCOUNTER — Emergency Department (HOSPITAL_COMMUNITY)
Admission: EM | Admit: 2024-05-28 | Discharge: 2024-05-29 | Disposition: A | Discharge status: Home / Self Care | Payer: Self-pay | Attending: Emergency Medicine | Admitting: Emergency Medicine

## 2024-05-28 ENCOUNTER — Emergency Department (HOSPITAL_COMMUNITY): Payer: Self-pay

## 2024-05-28 ENCOUNTER — Encounter (HOSPITAL_COMMUNITY): Payer: Self-pay

## 2024-05-28 ENCOUNTER — Other Ambulatory Visit: Payer: Self-pay

## 2024-05-28 DIAGNOSIS — I10 Essential (primary) hypertension: Secondary | ICD-10-CM | POA: Insufficient documentation

## 2024-05-28 DIAGNOSIS — J45909 Unspecified asthma, uncomplicated: Secondary | ICD-10-CM | POA: Insufficient documentation

## 2024-05-28 DIAGNOSIS — Z79899 Other long term (current) drug therapy: Secondary | ICD-10-CM | POA: Insufficient documentation

## 2024-05-28 DIAGNOSIS — F17210 Nicotine dependence, cigarettes, uncomplicated: Secondary | ICD-10-CM | POA: Insufficient documentation

## 2024-05-28 DIAGNOSIS — N451 Epididymitis: Secondary | ICD-10-CM | POA: Insufficient documentation

## 2024-05-28 LAB — BASIC METABOLIC PANEL WITH GFR
Anion gap: 7 (ref 5–15)
BUN: 16 mg/dL (ref 6–20)
CO2: 23 mmol/L (ref 22–32)
Calcium: 9.1 mg/dL (ref 8.9–10.3)
Chloride: 106 mmol/L (ref 98–111)
Creatinine, Ser: 1.03 mg/dL (ref 0.61–1.24)
GFR, Estimated: >60 mL/min (ref >60)
Glucose, Bld: 103 mg/dL — ABNORMAL HIGH (ref 70–99)
Potassium: 3.8 mmol/L (ref 3.5–5.1)
Sodium: 136 mmol/L (ref 135–145)

## 2024-05-28 LAB — CBC WITH DIFFERENTIAL/PLATELET
Abs Immature Granulocytes: 0.05 K/uL (ref 0.00–0.07)
Basophils Absolute: 0 K/uL (ref 0.0–0.1)
Basophils Relative: 0 %
Eosinophils Absolute: 0.1 K/uL (ref 0.0–0.5)
Eosinophils Relative: 1 %
HCT: 38.5 % — ABNORMAL LOW (ref 39.0–52.0)
Hemoglobin: 13 g/dL (ref 13.0–17.0)
Immature Granulocytes: 0 %
Lymphocytes Relative: 15 %
Lymphs Abs: 1.8 K/uL (ref 0.7–4.0)
MCH: 29.4 pg (ref 26.0–34.0)
MCHC: 33.8 g/dL (ref 30.0–36.0)
MCV: 87.1 fL (ref 80.0–100.0)
Monocytes Absolute: 0.5 K/uL (ref 0.1–1.0)
Monocytes Relative: 4 %
Neutro Abs: 9.4 K/uL — ABNORMAL HIGH (ref 1.7–7.7)
Neutrophils Relative %: 80 %
Platelets: 188 K/uL (ref 150–400)
RBC: 4.42 MIL/uL (ref 4.22–5.81)
RDW: 11.6 % (ref 11.5–15.5)
WBC: 11.9 K/uL — ABNORMAL HIGH (ref 4.0–10.5)
nRBC: 0 % (ref 0.0–0.2)

## 2024-05-28 MED ORDER — LIDOCAINE HCL (PF) 1 % IJ SOLN: 2.0000 mL | Freq: Once | INTRAMUSCULAR | Status: DC

## 2024-05-28 MED ORDER — CEFTRIAXONE SODIUM 1 G IJ SOLR: 1.0000 g | Freq: Once | INTRAMUSCULAR | Status: DC

## 2024-05-28 MED ORDER — KETOROLAC TROMETHAMINE 15 MG/ML IJ SOLN
15.0000 mg | Freq: Once | INTRAMUSCULAR | Status: AC
  Administered 2024-05-28: 15 mg via INTRAMUSCULAR
  Filled 2024-05-28: qty 1

## 2024-05-28 MED ORDER — IBUPROFEN 600 MG PO TABS: 600.0000 mg | ORAL_TABLET | Freq: Four times a day (QID) | ORAL | 0 refills | Status: AC | PRN

## 2024-05-28 MED ORDER — AMLODIPINE BESYLATE 5 MG PO TABS
10.0000 mg | ORAL_TABLET | Freq: Once | ORAL | Status: AC
  Administered 2024-05-28: 10 mg via ORAL
  Filled 2024-05-28: qty 2

## 2024-05-28 MED ORDER — SODIUM CHLORIDE 0.9 % IV SOLN
1.0000 g | Freq: Once | INTRAVENOUS | Status: AC
  Administered 2024-05-28: 1 g via INTRAVENOUS
  Filled 2024-05-28: qty 10

## 2024-05-28 MED ORDER — OXYCODONE HCL 5 MG PO TABS: 5.0000 mg | ORAL_TABLET | Freq: Four times a day (QID) | ORAL | 0 refills | Status: AC | PRN

## 2024-05-28 MED ORDER — DOXYCYCLINE HYCLATE 100 MG PO CAPS: 100.0000 mg | ORAL_CAPSULE | Freq: Two times a day (BID) | ORAL | 0 refills | Status: AC

## 2024-05-28 MED ORDER — OXYCODONE-ACETAMINOPHEN 5-325 MG PO TABS
1.0000 | ORAL_TABLET | Freq: Once | ORAL | Status: AC
  Administered 2024-05-28: 1 via ORAL
  Filled 2024-05-28: qty 1

## 2024-05-28 NOTE — Discharge Instructions (Addendum)
 We evaluated you for your testicular pain. Your ultrasound shows a testicle infection called epididymitis. This can be caused by a UTI or STD. We have sent urine and STD testing. We have prescribed you antibiotics. Please take these as prescribed. Please follow up with Urology (Dr. Watt). You can call for an appointment. Please return if your symptoms worsen or fail to improve. We will call you if your STD test results positive. If it is positive please discuss this with your sexual partner so they may be treated.   Please take Tylenol  (acetaminophen ) and Motrin  (ibuprofen ) for your symptoms at home.  You can take 1000 mg of Tylenol  every 6 hours and 600 mg of Motrin  every 6 hours as needed for your symptoms.  You can take these medicines together as needed, either at the same time, or alternating every 3 hours.  We have given you a small amount of oxycodone  which you can take for pain not relieved by Tylenol  and Motrin .  Do not drink alcohol, drive, or operate machinery when taking this medicine.

## 2024-05-28 NOTE — ED Triage Notes (Signed)
 Pt reports:  Testicle pain (left) Started a few weeks ago Pain worsening today Testicle swelling Noticed 4 days ago HTN Hx 170/120 at home Did not take BP medication today

## 2024-05-28 NOTE — ED Provider Notes (Signed)
 [place] EMERGENCY DEPARTMENT AT [facility] Provider Note  CSN: [id number] Arrival date & time: 05/28/24 2032  Chief Complaint(s) Testicle Pain, Groin Swelling, and Hypertension  HPI Cameron Greene is a 38 y.o. male history of hypertension presenting to the emergency department with testicle pain.  He reports testicle pain for the last few weeks, worsening today.  Noticed swelling over the past week.  Denies any dysuria.  No abdominal pain.  No injury to the scrotum.   Past Medical History Past Medical History:  Diagnosis Date   Arthritis    right knee (02/02/2018)   Asthma    Gout    Hypertension    Patient Active Problem List   Diagnosis Date Noted   S/P cholecystectomy 08/27/2022   Acute cholecystitis 08/26/2022   Precordial chest pain 02/02/2018   HTN (hypertension) 02/02/2018   Home Medication(s) Prior to Admission medications   Medication Sig Start Date End Date Taking? Authorizing Provider  doxycycline  (VIBRAMYCIN ) 100 MG capsule Take 1 capsule (100 mg total) by mouth 2 (two) times daily. 05/28/24  Yes Francesca Elsie CROME, MD  acetaminophen  (TYLENOL ) 500 MG tablet Take 1,500-2,000 mg by mouth daily as needed for moderate pain or headache.    [provider]  amLODipine  (NORVASC ) 10 MG tablet Take 1 tablet (10 mg total) by mouth daily. 08/27/22   Meuth, Brooke A, PA-C  oxyCODONE  (ROXICODONE ) 5 MG immediate release tablet Take 1 tablet (5 mg total) by mouth every 6 (six) hours as needed for severe pain. 08/27/22   Doug Lyle LABOR, PA-C                                                                                                                                    Past Surgical History Past Surgical History:  Procedure Laterality Date   CARDIAC CATHETERIZATION  02/02/2018   CHOLECYSTECTOMY N/A 08/27/2022   Procedure: LAPAROSCOPIC CHOLECYSTECTOMY;  Surgeon: Vernetta Berg, MD;  Location: [facility] OR;  Service: General;  Laterality: N/A;   KNEE  ARTHROSCOPY Right ~ 2009   shaved behind my kneecap   LEFT HEART CATH AND CORONARY ANGIOGRAPHY N/A 02/02/2018   Procedure: LEFT HEART CATH AND CORONARY ANGIOGRAPHY;  Surgeon: [place], Peter M, MD;  Location: [facility] INVASIVE CV LAB;  Service: Cardiovascular;  Laterality: N/A;   Family History Family History  Problem Relation Age of Onset   Hypertension Mother    Asthma Mother     Social History Social History   Tobacco Use   Smoking status: Every Day    Current packs/day: 0.10    Average packs/day: 0.1 packs/day for 23.0 years (2.3 ttl pk-yrs)    Types: Cigarettes   Smokeless tobacco: Never  Vaping Use   Vaping status: Never Used  Substance Use Topics   Alcohol use: Not Currently    Comment: 02/02/2018 a few drinks/month   Drug use: Yes    Frequency: 7.0 times per week  Types: Marijuana    Comment: 02/02/2018 daily   Allergies Bee venom, Fish allergy, Apple, and Banana  Review of Systems Review of Systems  All other systems reviewed and are negative.   Physical Exam Vital Signs  I have reviewed the triage vital signs BP (!) 212/108   Pulse 84   Temp 100.1 F (37.8 C) (Oral)   Resp 18   Ht 6' (1.829 m)   Wt (!) 151.5 kg   SpO2 96%   BMI 45.30 kg/m  Physical Exam Vitals and nursing note reviewed.  Constitutional:      General: He is not in acute distress.    Appearance: Normal appearance.  HENT:     Mouth/Throat:     Mouth: Mucous membranes are moist.  Eyes:     Conjunctiva/sclera: Conjunctivae normal.  Cardiovascular:     Rate and Rhythm: Normal rate and regular rhythm.  Pulmonary:     Effort: Pulmonary effort is normal. No respiratory distress.     Breath sounds: Normal breath sounds.  Abdominal:     General: Abdomen is flat.     Palpations: Abdomen is soft.     Tenderness: There is no abdominal tenderness.  Genitourinary:    Comments: Swelling to the left testicle with significant tenderness.  Right testicle appears normal.  Otherwise normal  external genitalia.  Chaperoned by RN Musculoskeletal:     Right lower leg: No edema.     Left lower leg: No edema.  Skin:    General: Skin is warm and dry.     Capillary Refill: Capillary refill takes less than 2 seconds.  Neurological:     Mental Status: He is alert and oriented to person, place, and time. Mental status is at baseline.  Psychiatric:        Mood and Affect: Mood normal.        Behavior: Behavior normal.     ED Results and Treatments Labs (all labs ordered are listed, but only abnormal results are displayed) Labs Reviewed  URINALYSIS, W/ REFLEX TO CULTURE (INFECTION SUSPECTED)  BASIC METABOLIC PANEL WITH GFR  CBC WITH DIFFERENTIAL/PLATELET  GC/CHLAMYDIA PROBE AMP ([place]) NOT AT [facility]                                                                                                                          Radiology US  SCROTUM W/DOPPLER Result Date: 05/28/2024 CLINICAL DATA:  Scrotal pain. EXAM: SCROTAL ULTRASOUND DOPPLER ULTRASOUND OF THE TESTICLES TECHNIQUE: Complete ultrasound examination of the testicles, epididymis, and other scrotal structures was performed. Color and spectral Doppler ultrasound were also utilized to evaluate blood flow to the testicles. COMPARISON:  None Available. FINDINGS: Right testicle Measurements: 4.5 cm x 2.2 cm x 3.4 cm. No mass or microlithiasis visualized. Left testicle Measurements: 3.6 cm x 2.5 cm x 3.4 cm. No mass or microlithiasis visualized. Right epididymis: A 0.4 cm x 0.4 cm x 0.4 cm cyst is seen within the right epididymis. Left epididymis:  The left epididymis is enlarged and heterogeneous in appearance. Increased epididymal flow is noted on color Doppler evaluation. Hydrocele: There is a small right-sided hydrocele. A moderate sized left-sided hydrocele is also noted. Varicocele:  None visualized. Pulsed Doppler interrogation of both testes demonstrates normal low resistance arterial and venous waveforms bilaterally. IMPRESSION:  1. Findings consistent with left-sided epididymitis. 2. Bilateral hydroceles, left greater than right. Electronically Signed   By: Suzen Dials M.D.   On: 05/28/2024 22:31    Pertinent labs & imaging results that were available during my care of the patient were reviewed by me and considered in my medical decision making (see MDM for details).  Medications Ordered in ED Medications  cefTRIAXone  (ROCEPHIN ) 1 g in sodium chloride  0.9 % 100 mL IVPB (has no administration in time range)  amLODipine  (NORVASC ) tablet 10 mg (has no administration in time range)  ketorolac  (TORADOL ) 15 MG/ML injection 15 mg (15 mg Intramuscular Given 05/28/24 2242)  oxyCODONE -acetaminophen  (PERCOCET/ROXICET) 5-325 MG per tablet 1 tablet (1 tablet Oral Given 05/28/24 2241)                                                                                                                                     Procedures Procedures  (including critical care time)  Medical Decision Making / ED Course   MDM:  38 year old presenting to the emergency department testicular pain.  Ultrasound was obtained and shows epididymitis.  Differential includes STD or UTI.  Will check labs including urinalysis, gonorrhea and chlamydia probe.  Will also check some basic labs.  Patient very hypertensive but did not take his blood pressure medication today.  Will give this to him.  Does feel better after pain medication.  Clinical Course as of 05/28/24 2304  [place] May 28, 2024  2303 Signed out to oncoming provider pending labs, reassessment.  Anticipate likely discharge. [WS]    Clinical Course User Index [WS] Francesca Elsie CROME, MD     Additional history obtained: -Additional history obtained from spouse -External records from outside source obtained and reviewed including: Chart review including previous notes, labs, imaging, consultation notes including prior notes    Lab Tests: -I ordered, reviewed, and interpreted labs.    The pertinent results include:   Labs Reviewed  URINALYSIS, W/ REFLEX TO CULTURE (INFECTION SUSPECTED)  BASIC METABOLIC PANEL WITH GFR  CBC WITH DIFFERENTIAL/PLATELET  GC/CHLAMYDIA PROBE AMP ([place]) NOT AT [facility]    Notable for pending results     Imaging Studies ordered: I ordered imaging studies including US  scrotum  On my interpretation imaging demonstrates epididymitis  I independently visualized and interpreted imaging. I agree with the radiologist interpretation   Medicines ordered and prescription drug management: Meds ordered this encounter  Medications   ketorolac  (TORADOL ) 15 MG/ML injection 15 mg   oxyCODONE -acetaminophen  (PERCOCET/ROXICET) 5-325 MG per tablet 1 tablet    Refill:  0   DISCONTD: cefTRIAXone  (ROCEPHIN ) injection  1 g    Antibiotic Indication::   UTI   DISCONTD: lidocaine  (PF) (XYLOCAINE ) 1 % injection 2 mL   doxycycline  (VIBRAMYCIN ) 100 MG capsule    Sig: Take 1 capsule (100 mg total) by mouth 2 (two) times daily.    Dispense:  20 capsule    Refill:  0   cefTRIAXone  (ROCEPHIN ) 1 g in sodium chloride  0.9 % 100 mL IVPB    Antibiotic Indication::   STD   amLODipine  (NORVASC ) tablet 10 mg    -I have reviewed the patients home medicines and have made adjustments as needed   Social Determinants of Health:  Diagnosis or treatment significantly limited by social determinants of health: obesity   Reevaluation: After the interventions noted above, I reevaluated the patient and found that their symptoms have improved  Co morbidities that complicate the patient evaluation  Past Medical History:  Diagnosis Date   Arthritis    right knee (02/02/2018)   Asthma    Gout    Hypertension       Dispostion: Disposition decision including need for hospitalization was considered, and patient disposition pending at time of sign out.    Final Clinical Impression(s) / ED Diagnoses Final diagnoses:  Epididymitis     This chart was dictated  using voice recognition software.  Despite best efforts to proofread,  errors can occur which can change the documentation meaning.    Francesca Elsie CROME, MD 05/28/24 [phone]

## 2024-05-28 NOTE — ED Provider Notes (Signed)
 Care of patient received from prior provider at 11:09 PM, please see their note for complete H/P and care plan.  Received handoff per ED course.  Clinical Course as of 05/28/24 2309  [place] May 28, 2024  2303 Signed out to oncoming provider pending labs, reassessment.  Anticipate likely discharge. [WS]  2305 Stable HO WLS Likely epididmytis on US  Pain x weeks HTN chronic Checking labs ABX to go [CC]    Clinical Course User Index [CC] Jerral Meth, MD [WS] Francesca Elsie CROME, MD    Reassessment: No focal pathology on blood work.  Stable for outpatient care management follow-up with PCP.     Jerral Meth, MD 05/29/24 0111

## 2024-05-28 NOTE — ED Notes (Signed)
[place] in PTs room

## 2024-05-28 NOTE — ED Notes (Signed)
 [place] finished

## 2024-05-29 LAB — URINALYSIS, W/ REFLEX TO CULTURE (INFECTION SUSPECTED)
Bacteria, UA: NONE SEEN
Bilirubin Urine: NEGATIVE
Glucose, UA: NEGATIVE mg/dL
Ketones, ur: NEGATIVE mg/dL
Nitrite: NEGATIVE
Protein, ur: NEGATIVE mg/dL
Specific Gravity, Urine: 1.012 (ref 1.005–1.030)
pH: 8 (ref 5.0–8.0)

## 2024-05-30 LAB — URINE CULTURE: Culture: NO GROWTH

## 2024-05-31 LAB — GC/CHLAMYDIA PROBE AMP (~~LOC~~) NOT AT ARMC
Chlamydia: POSITIVE — AB
Comment: NEGATIVE
Comment: NORMAL
Neisseria Gonorrhea: NEGATIVE

## 2024-06-01 ENCOUNTER — Ambulatory Visit (HOSPITAL_COMMUNITY): Payer: Self-pay

## 2024-08-07 ENCOUNTER — Emergency Department (HOSPITAL_COMMUNITY): Payer: Self-pay

## 2024-08-07 ENCOUNTER — Emergency Department (HOSPITAL_COMMUNITY)
Admission: EM | Admit: 2024-08-07 | Discharge: 2024-08-07 | Disposition: A | Discharge status: Home / Self Care | Payer: Self-pay | Attending: Emergency Medicine | Admitting: Emergency Medicine

## 2024-08-07 ENCOUNTER — Encounter (HOSPITAL_COMMUNITY): Payer: Self-pay

## 2024-08-07 DIAGNOSIS — W3400XA Accidental discharge from unspecified firearms or gun, initial encounter: Secondary | ICD-10-CM | POA: Insufficient documentation

## 2024-08-07 DIAGNOSIS — R000 Tachycardia, unspecified: Secondary | ICD-10-CM | POA: Insufficient documentation

## 2024-08-07 DIAGNOSIS — I10 Essential (primary) hypertension: Secondary | ICD-10-CM | POA: Insufficient documentation

## 2024-08-07 DIAGNOSIS — S41002A Unspecified open wound of left shoulder, initial encounter: Secondary | ICD-10-CM | POA: Insufficient documentation

## 2024-08-07 DIAGNOSIS — Z23 Encounter for immunization: Secondary | ICD-10-CM | POA: Insufficient documentation

## 2024-08-07 LAB — CBC WITH DIFFERENTIAL/PLATELET
Abs Immature Granulocytes: 0.02 K/uL (ref 0.00–0.07)
Basophils Absolute: 0 K/uL (ref 0.0–0.1)
Basophils Relative: 0 %
Eosinophils Absolute: 0.1 K/uL (ref 0.0–0.5)
Eosinophils Relative: 1 %
HCT: 39.9 % (ref 39.0–52.0)
Hemoglobin: 13.6 g/dL (ref 13.0–17.0)
Immature Granulocytes: 0 %
Lymphocytes Relative: 32 %
Lymphs Abs: 3.7 K/uL (ref 0.7–4.0)
MCH: 29.4 pg (ref 26.0–34.0)
MCHC: 34.1 g/dL (ref 30.0–36.0)
MCV: 86.2 fL (ref 80.0–100.0)
Monocytes Absolute: 0.7 K/uL (ref 0.1–1.0)
Monocytes Relative: 6 %
Neutro Abs: 7 K/uL (ref 1.7–7.7)
Neutrophils Relative %: 61 %
Platelets: 212 K/uL (ref 150–400)
RBC: 4.63 MIL/uL (ref 4.22–5.81)
RDW: 12.1 % (ref 11.5–15.5)
WBC: 11.6 K/uL — ABNORMAL HIGH (ref 4.0–10.5)
nRBC: 0 % (ref 0.0–0.2)

## 2024-08-07 LAB — COMPREHENSIVE METABOLIC PANEL WITH GFR
ALT: 54 U/L — ABNORMAL HIGH (ref 0–44)
AST: 30 U/L (ref 15–41)
Albumin: 4.3 g/dL (ref 3.5–5.0)
Alkaline Phosphatase: 144 U/L — ABNORMAL HIGH (ref 38–126)
Anion gap: 15 (ref 5–15)
BUN: 20 mg/dL (ref 6–20)
CO2: 22 mmol/L (ref 22–32)
Calcium: 9.5 mg/dL (ref 8.9–10.3)
Chloride: 104 mmol/L (ref 98–111)
Creatinine, Ser: 1.33 mg/dL — ABNORMAL HIGH (ref 0.61–1.24)
GFR, Estimated: >60 mL/min (ref >60)
Glucose, Bld: 128 mg/dL — ABNORMAL HIGH (ref 70–99)
Potassium: 3.2 mmol/L — ABNORMAL LOW (ref 3.5–5.1)
Sodium: 141 mmol/L (ref 135–145)
Total Bilirubin: 0.5 mg/dL (ref 0.0–1.2)
Total Protein: 7.3 g/dL (ref 6.5–8.1)

## 2024-08-07 LAB — I-STAT CHEM 8, ED
BUN: 21 mg/dL — ABNORMAL HIGH (ref 6–20)
Calcium, Ion: 1.15 mmol/L (ref 1.15–1.40)
Chloride: 104 mmol/L (ref 98–111)
Creatinine, Ser: 1.5 mg/dL — ABNORMAL HIGH (ref 0.61–1.24)
Glucose, Bld: 120 mg/dL — ABNORMAL HIGH (ref 70–99)
HCT: 41 % (ref 39.0–52.0)
Hemoglobin: 13.9 g/dL (ref 13.0–17.0)
Potassium: 3.1 mmol/L — ABNORMAL LOW (ref 3.5–5.1)
Sodium: 142 mmol/L (ref 135–145)
TCO2: 23 mmol/L (ref 22–32)

## 2024-08-07 MED ORDER — HYDROMORPHONE HCL 1 MG/ML IJ SOLN
1.0000 mg | Freq: Once | INTRAMUSCULAR | Status: AC
  Administered 2024-08-07: 1 mg via INTRAVENOUS
  Filled 2024-08-07: qty 1

## 2024-08-07 MED ORDER — AMLODIPINE BESYLATE 5 MG PO TABS: 5.0000 mg | ORAL_TABLET | Freq: Every day | ORAL | 1 refills | Status: AC

## 2024-08-07 MED ORDER — TETANUS-DIPHTH-ACELL PERTUSSIS 5-2.5-18.5 LF-MCG/0.5 IM SUSY
0.5000 mL | PREFILLED_SYRINGE | Freq: Once | INTRAMUSCULAR | Status: AC
  Administered 2024-08-07: 0.5 mL via INTRAMUSCULAR
  Filled 2024-08-07: qty 0.5

## 2024-08-07 MED ORDER — LORAZEPAM 2 MG/ML IJ SOLN
0.5000 mg | Freq: Once | INTRAMUSCULAR | Status: AC
  Administered 2024-08-07: 0.5 mg via INTRAVENOUS
  Filled 2024-08-07: qty 1

## 2024-08-07 MED ORDER — OXYCODONE-ACETAMINOPHEN 5-325 MG PO TABS: 1.0000 | ORAL_TABLET | Freq: Four times a day (QID) | ORAL | 0 refills | Status: AC | PRN

## 2024-08-07 NOTE — ED Notes (Signed)
 Wounds cleansed with NS solution; gauze and ABD dressing placed over wounds.
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
